# Patient Record
Sex: Female | Born: 1960 | Race: White | Hispanic: No | Marital: Married | Smoking: Former smoker
Health system: Southern US, Community
[De-identification: ages and names within clinical notes are randomized; demographics above are authoritative.]

## PROBLEM LIST (undated history)

## (undated) DIAGNOSIS — K219 Gastro-esophageal reflux disease without esophagitis: Secondary | ICD-10-CM

## (undated) DIAGNOSIS — R011 Cardiac murmur, unspecified: Secondary | ICD-10-CM

## (undated) DIAGNOSIS — I429 Cardiomyopathy, unspecified: Secondary | ICD-10-CM

## (undated) DIAGNOSIS — N3281 Overactive bladder: Secondary | ICD-10-CM

## (undated) DIAGNOSIS — R0602 Shortness of breath: Secondary | ICD-10-CM

## (undated) DIAGNOSIS — E162 Hypoglycemia, unspecified: Secondary | ICD-10-CM

## (undated) DIAGNOSIS — K2270 Barrett's esophagus without dysplasia: Secondary | ICD-10-CM

## (undated) DIAGNOSIS — D649 Anemia, unspecified: Secondary | ICD-10-CM

## (undated) DIAGNOSIS — I2089 Other forms of angina pectoris: Secondary | ICD-10-CM

## (undated) DIAGNOSIS — E785 Hyperlipidemia, unspecified: Secondary | ICD-10-CM

## (undated) DIAGNOSIS — G600 Hereditary motor and sensory neuropathy: Secondary | ICD-10-CM

## (undated) DIAGNOSIS — I10 Essential (primary) hypertension: Secondary | ICD-10-CM

## (undated) DIAGNOSIS — F32A Depression, unspecified: Secondary | ICD-10-CM

## (undated) DIAGNOSIS — Z9889 Other specified postprocedural states: Secondary | ICD-10-CM

## (undated) DIAGNOSIS — F419 Anxiety disorder, unspecified: Secondary | ICD-10-CM

## (undated) DIAGNOSIS — I208 Other forms of angina pectoris: Secondary | ICD-10-CM

## (undated) DIAGNOSIS — J189 Pneumonia, unspecified organism: Secondary | ICD-10-CM

## (undated) DIAGNOSIS — G629 Polyneuropathy, unspecified: Secondary | ICD-10-CM

## (undated) DIAGNOSIS — E538 Deficiency of other specified B group vitamins: Secondary | ICD-10-CM

## (undated) HISTORY — PX: ABDOMINAL HYSTERECTOMY: SHX81

## (undated) HISTORY — PX: COLONOSCOPY: SHX174

---
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
## 1979-12-27 HISTORY — PX: KNEE LIGAMENT RECONSTRUCTION: SHX1895

## 1983-12-27 HISTORY — PX: TUBAL LIGATION: SHX77

## 1985-12-26 HISTORY — PX: KNEE ARTHROSCOPY: SUR90

## 1993-12-26 HISTORY — PX: CARPAL TUNNEL RELEASE: SHX101

## 1994-12-26 HISTORY — PX: CARPAL TUNNEL RELEASE: SHX101

## 1994-12-26 HISTORY — PX: ABDOMINAL HYSTERECTOMY: SHX81

## 2004-11-12 HISTORY — PX: LAPAROSCOPIC GASTRIC BYPASS: SUR771

## 2004-12-12 HISTORY — PX: EVALUATION UNDER ANESTHESIA WITH HEMORRHOIDECTOMY AND PROCTOSCOPY: SHX5625

## 2007-08-17 ENCOUNTER — Encounter: Payer: Self-pay | Admitting: General Practice

## 2007-08-27 ENCOUNTER — Encounter: Payer: Self-pay | Admitting: General Practice

## 2009-10-21 ENCOUNTER — Ambulatory Visit: Payer: Self-pay | Admitting: Nurse Practitioner

## 2009-10-21 IMAGING — MG MM CAD SCREENING MAMMO
1 series · 4 of 4 positions shown · non-contrast
Comparison: none

REASON FOR EXAM: Screening Mammo
COMMENTS:

PROCEDURE:     MAM - MAM DGTL SCREENING MAMMO W/CAD  - [DATE]  [DATE]
RESULT:     Comparison is made to prior study dated [DATE] and [DATE].
The breasts demonstrate a heterogeneous parenchymal pattern. There is no
radiographic evidence to suggest malignancy.

[R CC · right · 4 of 4 slices shown]
[im 1/4]
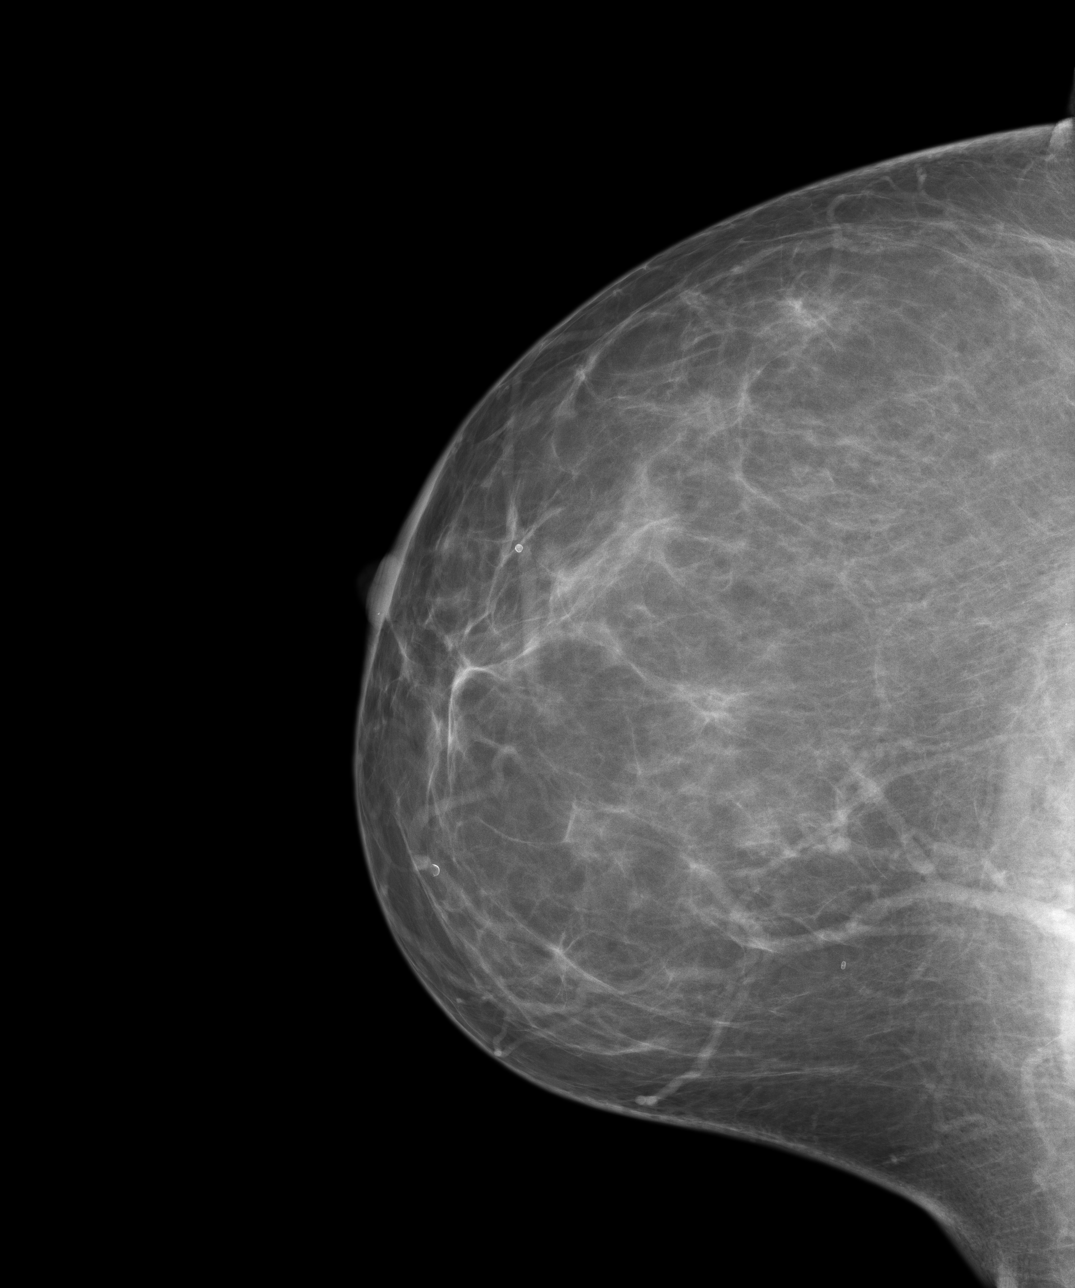
[im 2/4]
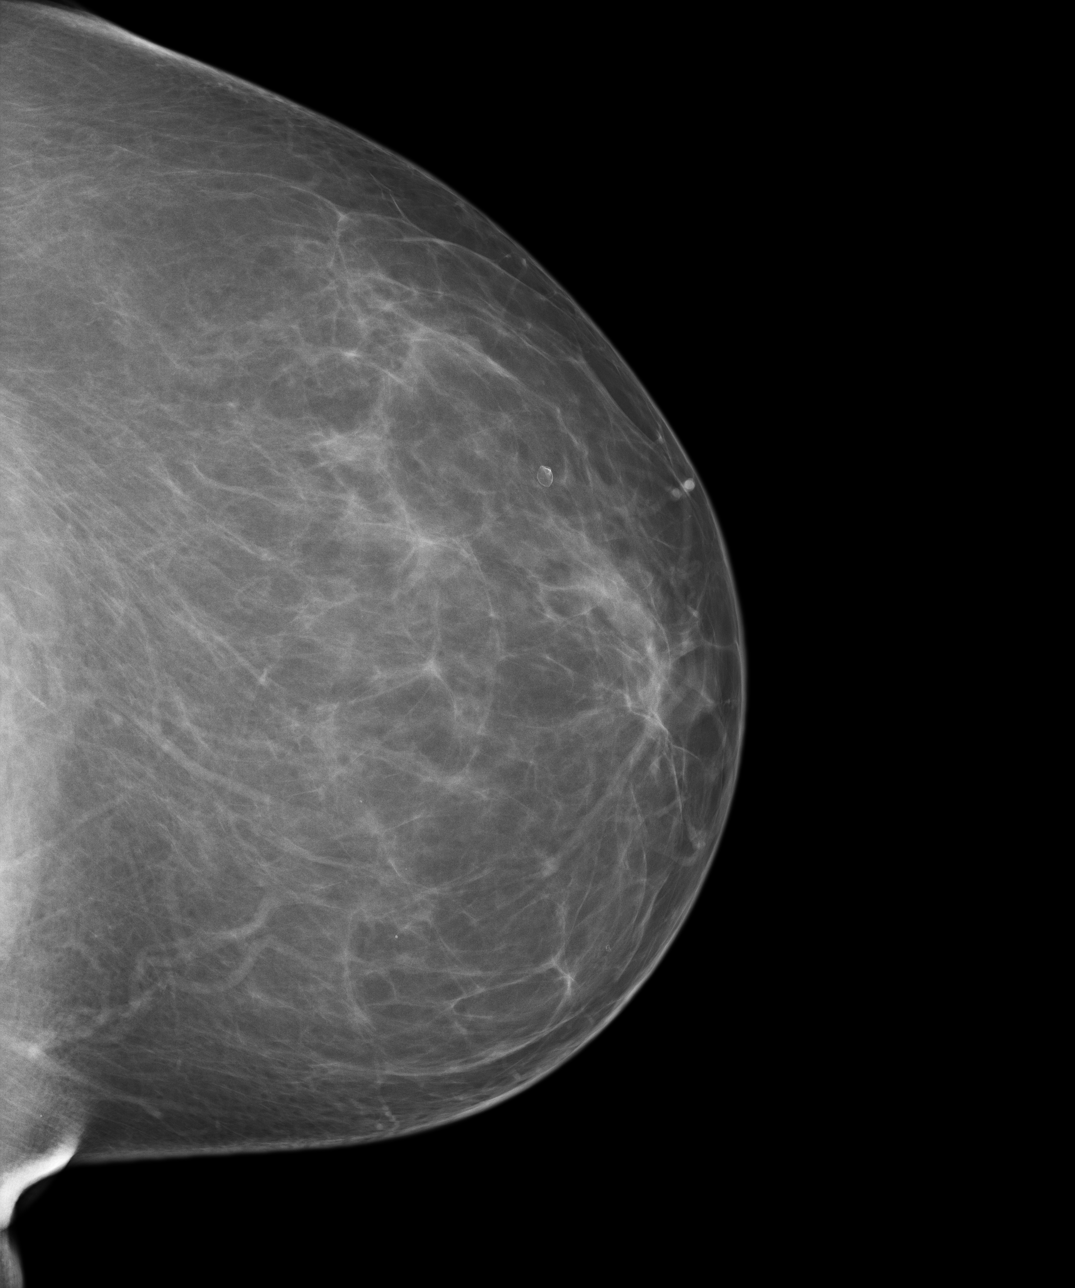
[im 3/4]
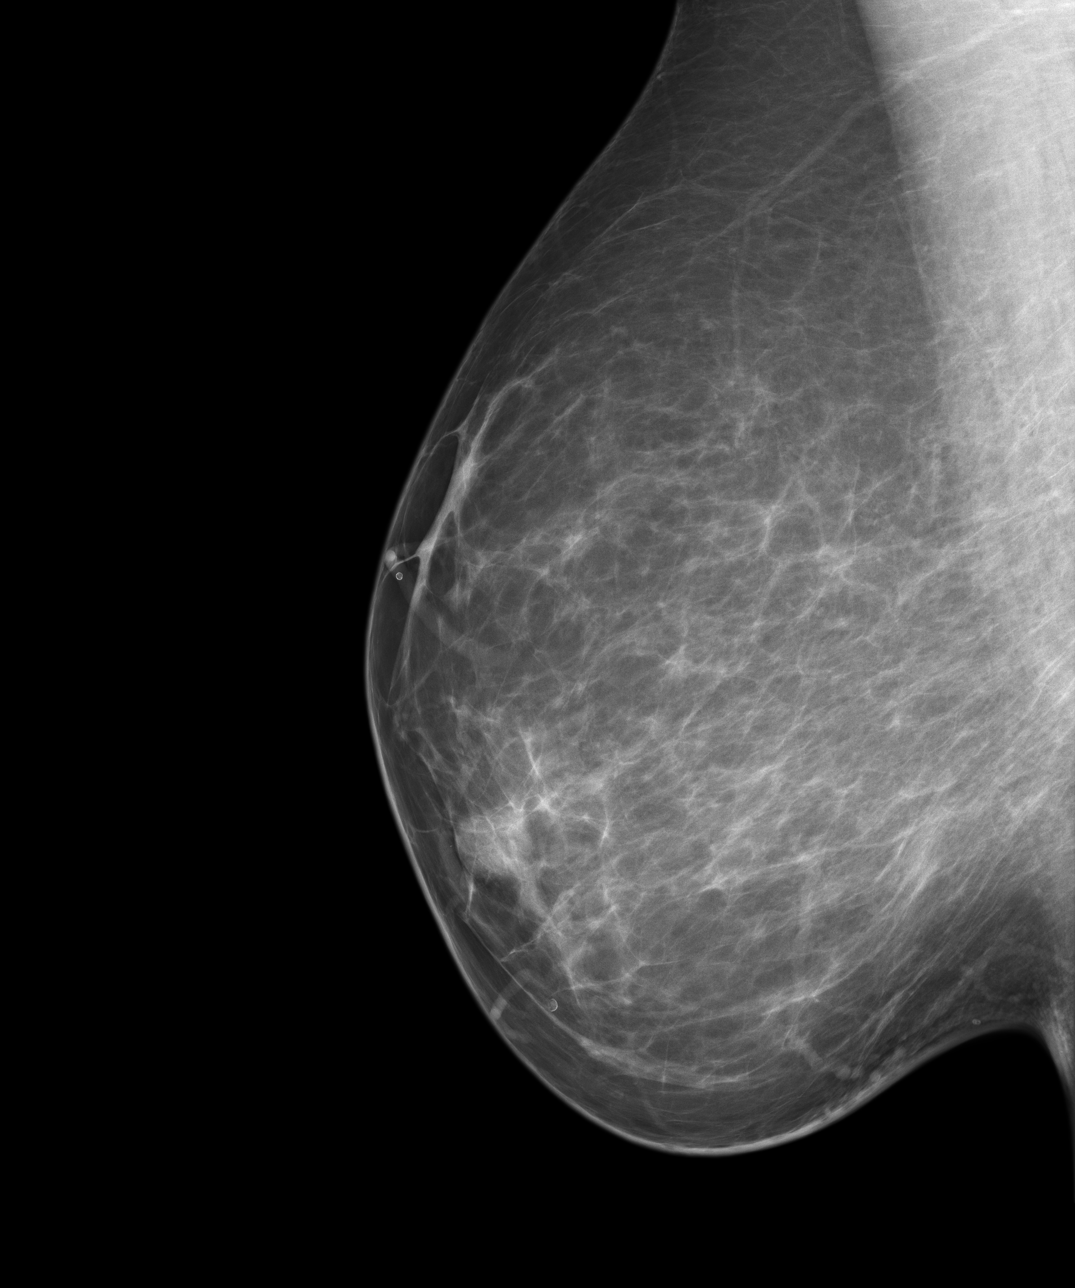
[im 4/4]
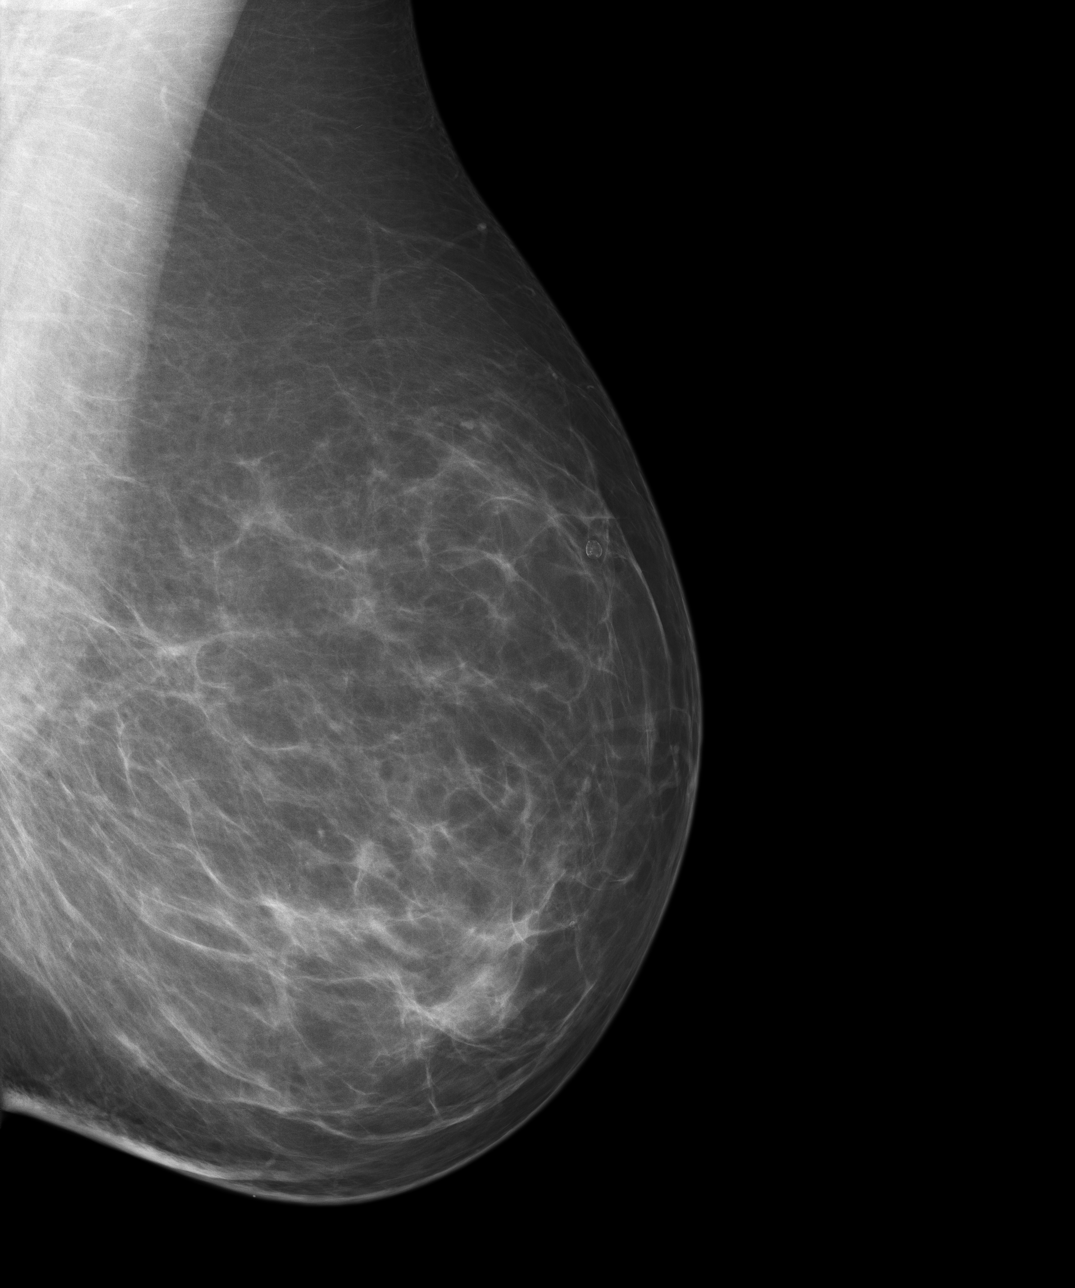

[4 of 4 positions shown; findings below may reference images not displayed]

IMPRESSION: BI-RADS: Category 2 - Benign Findings

A NEGATIVE MAMMOGRAM REPORT DOES NOT PRECLUDE BIOPSY OR OTHER EVALUATION OF
A CLINICALLY PALPABLE OR OTHERWISE SUSPICIOUS MASS OR LESION. BREAST CANCER
MAY NOT BE DETECTED BY MAMMOGRAPHY IN UP TO 10% OF CASES.

## 2010-05-03 HISTORY — PX: EVALUATION UNDER ANESTHESIA WITH HEMORRHOIDECTOMY AND PROCTOSCOPY: SHX5625

## 2011-02-24 ENCOUNTER — Ambulatory Visit: Payer: Self-pay | Admitting: Family Medicine

## 2011-02-24 IMAGING — MG MM DIGITAL SCREENING BILAT W/ CAD
1 series · 4 of 4 positions shown · non-contrast
Comparison: none

REASON FOR EXAM: scr
COMMENTS:

PROCEDURE:     MMM - MMM DGTL SCREENING MAMMO W/CAD  - [DATE]  [DATE]
RESULT:     Comparison is made to prior examinations [DATE],[DATE] and [DATE].
There are scattered fibroglandular densities bilaterally. No mass or
malignant appearing calcifications are identified.

[Series 7970: R CC · right · 4 of 4 slices shown]
[im 1/4]
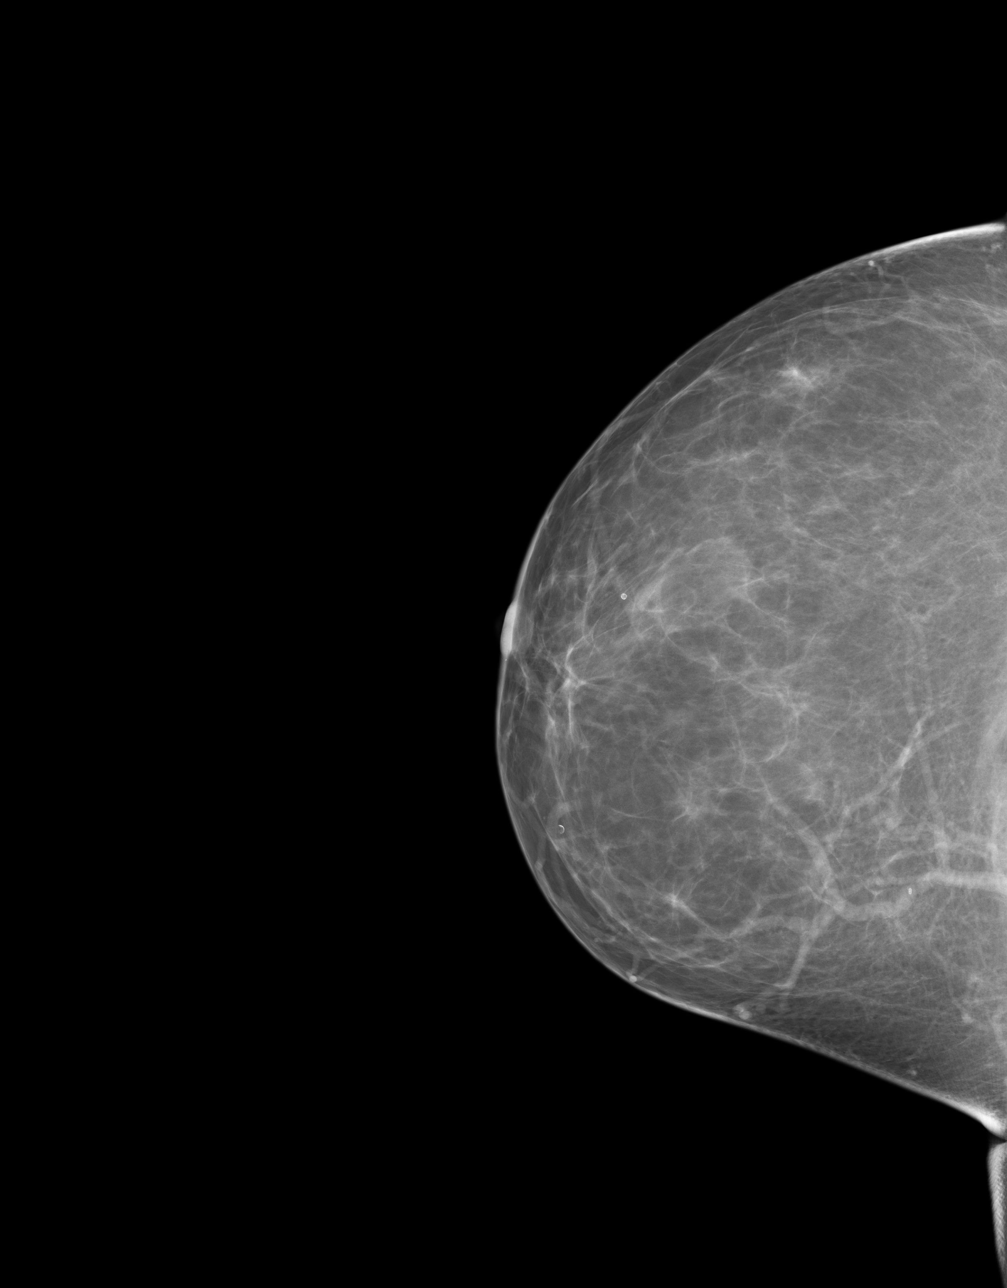
[im 2/4]
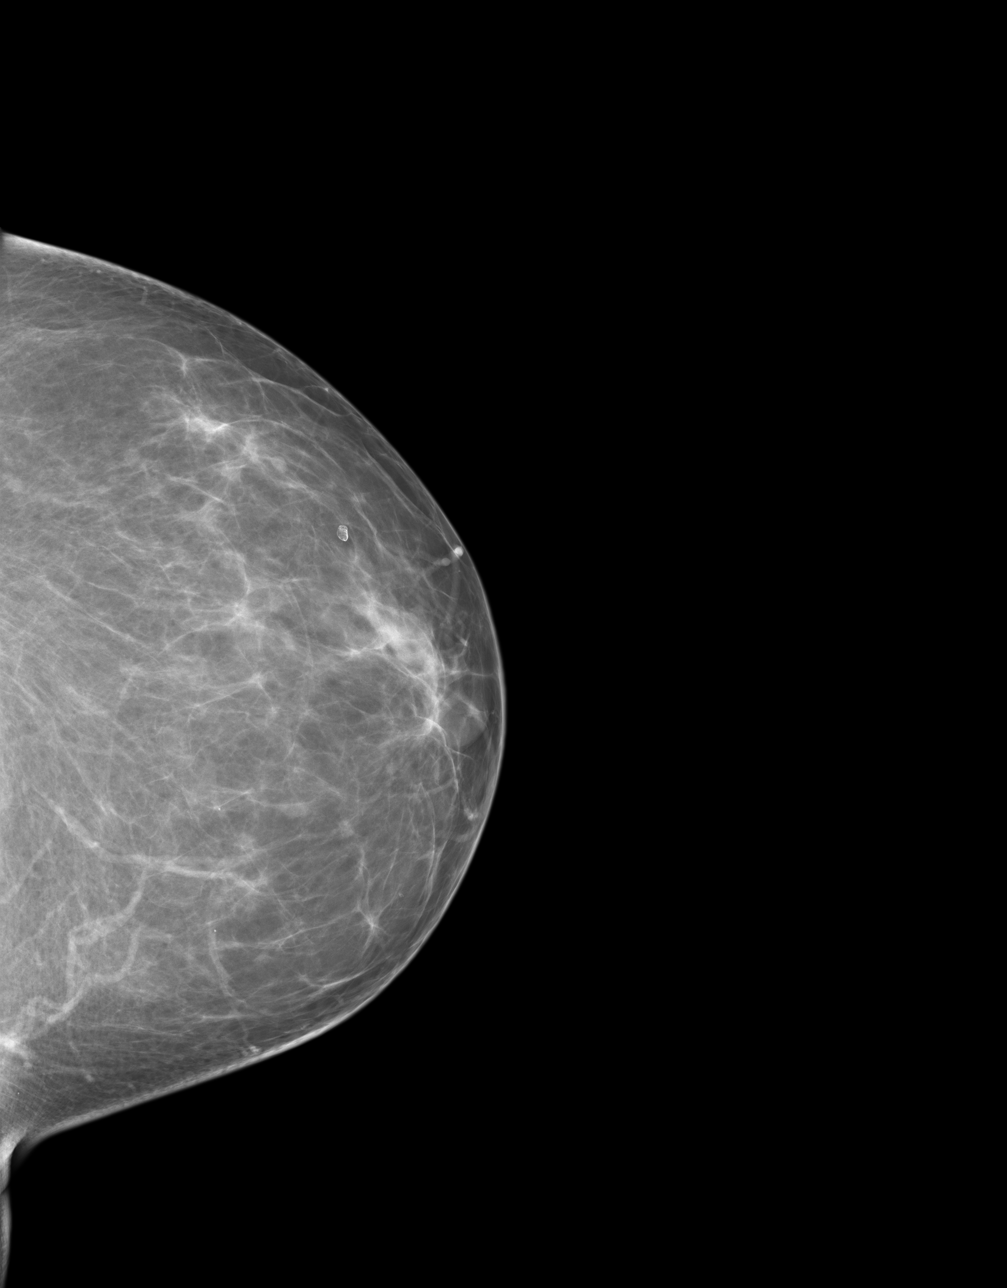
[im 3/4]
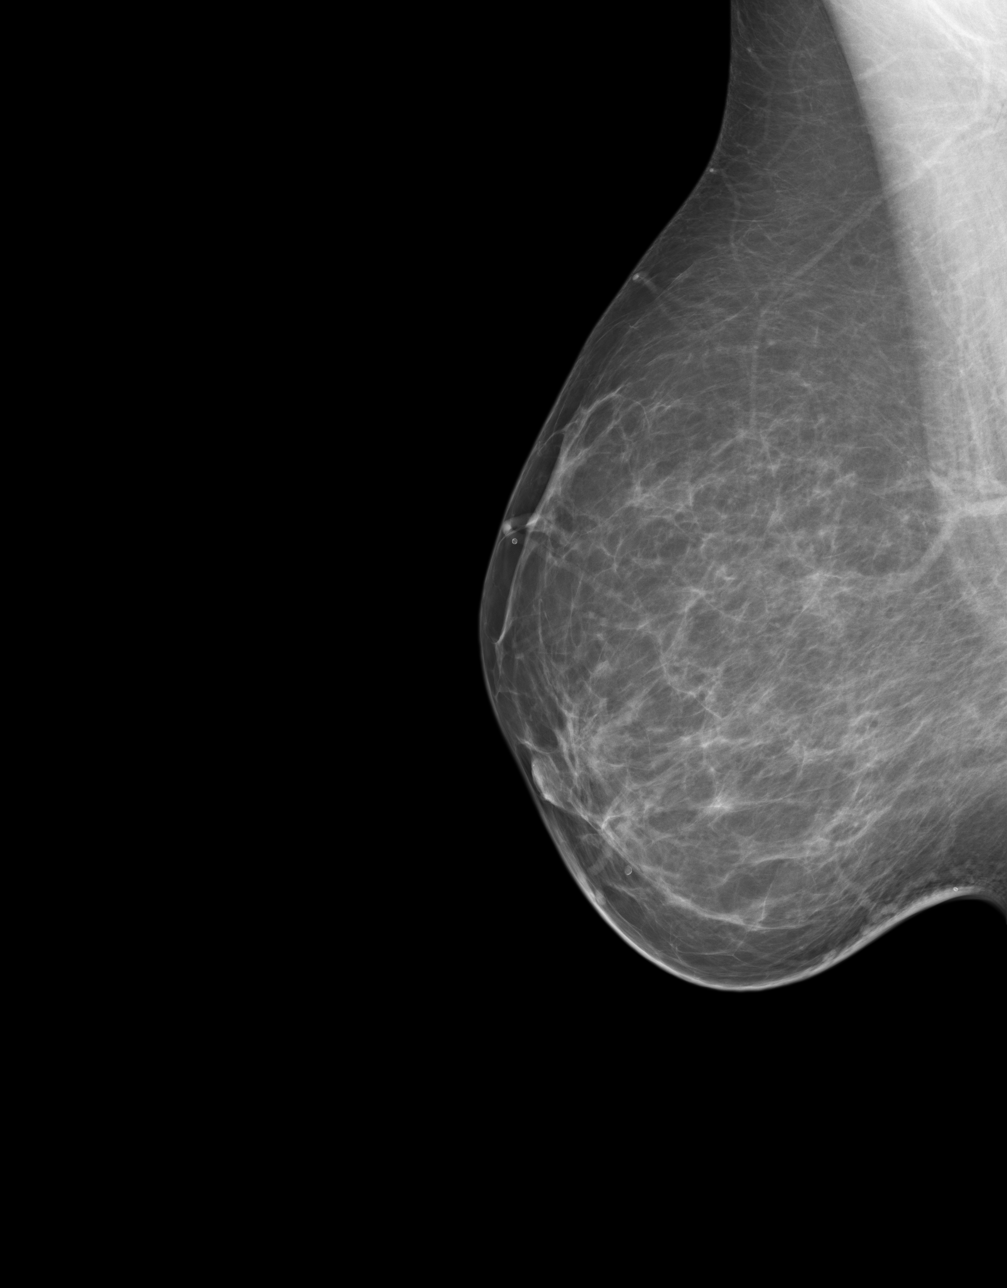
[im 4/4]
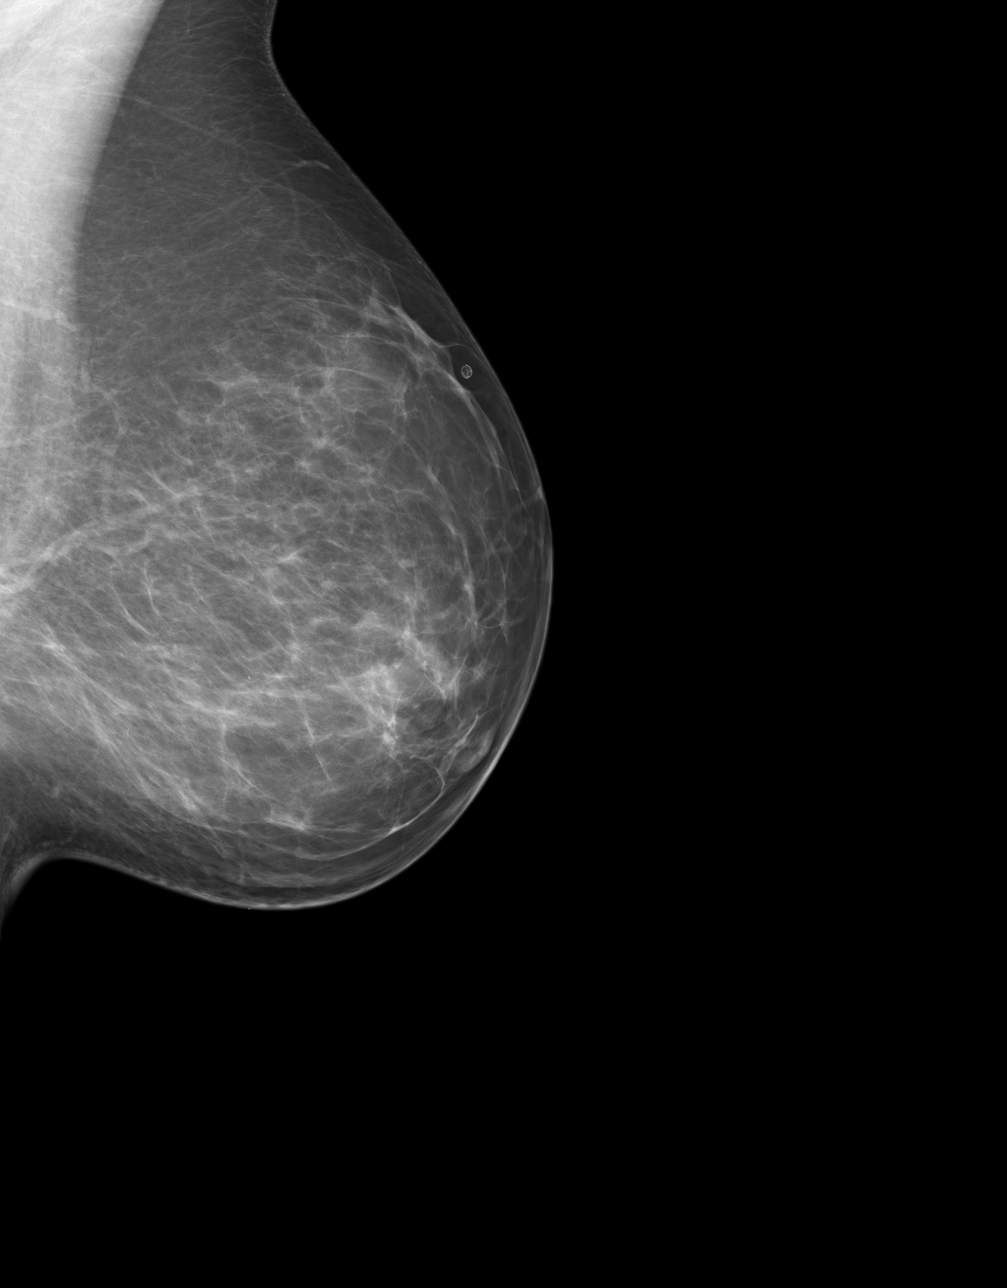

[4 of 4 positions shown; findings below may reference images not displayed]

IMPRESSION: 1.  Bilaterally benign appearing screening mammography.
2.  Annual screening mammography is recommended.
3.  BI-RADS: Category 1 Negative.

Thank you for this opportunity to contribute to the care of your patient.

A NEGATIVE MAMMOGRAM REPORT DOES NOT PRECLUDE BIOPSY OR OTHER EVALUATION OF
A CLINICALLY PALPABLE OR OTHERWISE SUSPICIOUS MASS OR LESION. BREAST CANCER
MAY NOT BE DETECTED BY MAMMOGRAPHY IN UP TO 10% OF CASES.

## 2012-10-17 ENCOUNTER — Ambulatory Visit: Payer: Self-pay | Admitting: Family

## 2012-10-17 IMAGING — MG MM DIGITAL SCREENING BILAT W/ CAD
1 series · 4 of 4 positions shown · non-contrast
Comparison: none

REASON FOR EXAM: SCR
COMMENTS:

PROCEDURE:     MMM - MMM DGT SCR NO ORDER W/CAD  - [DATE]  [DATE]
RESULT:     COMPARISON:  [DATE], [DATE], [DATE] from [REDACTED]
TECHNIQUE: Digital screening mammograms were obtained. FDA approved
computer-aided detection (CAD) for mammography was utilized for this study.

[Series 9469: R CC · right · 4 of 4 slices shown]
[im 1/4]
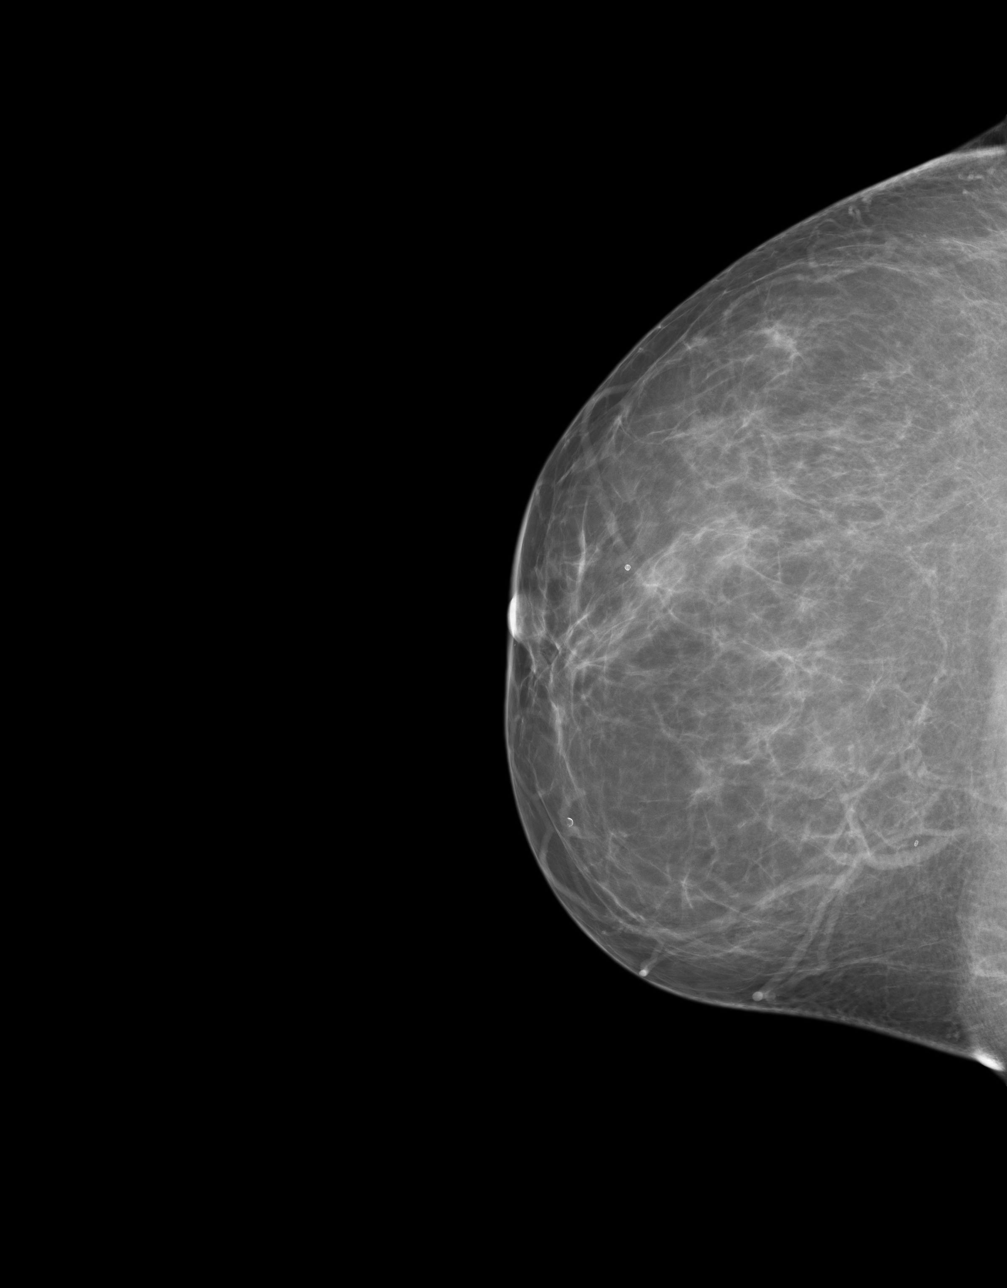
[im 2/4]
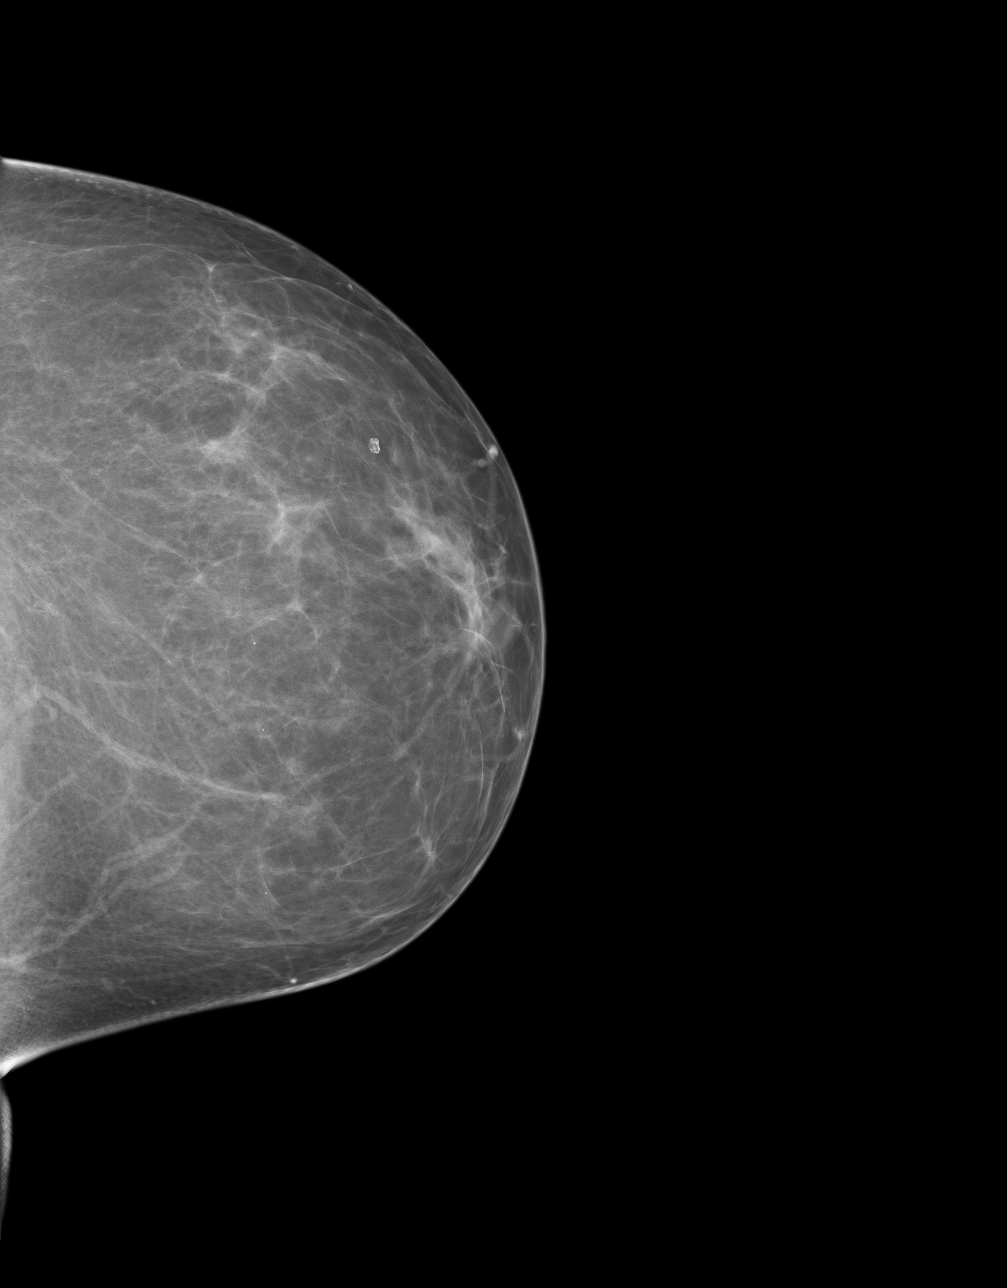
[im 3/4]
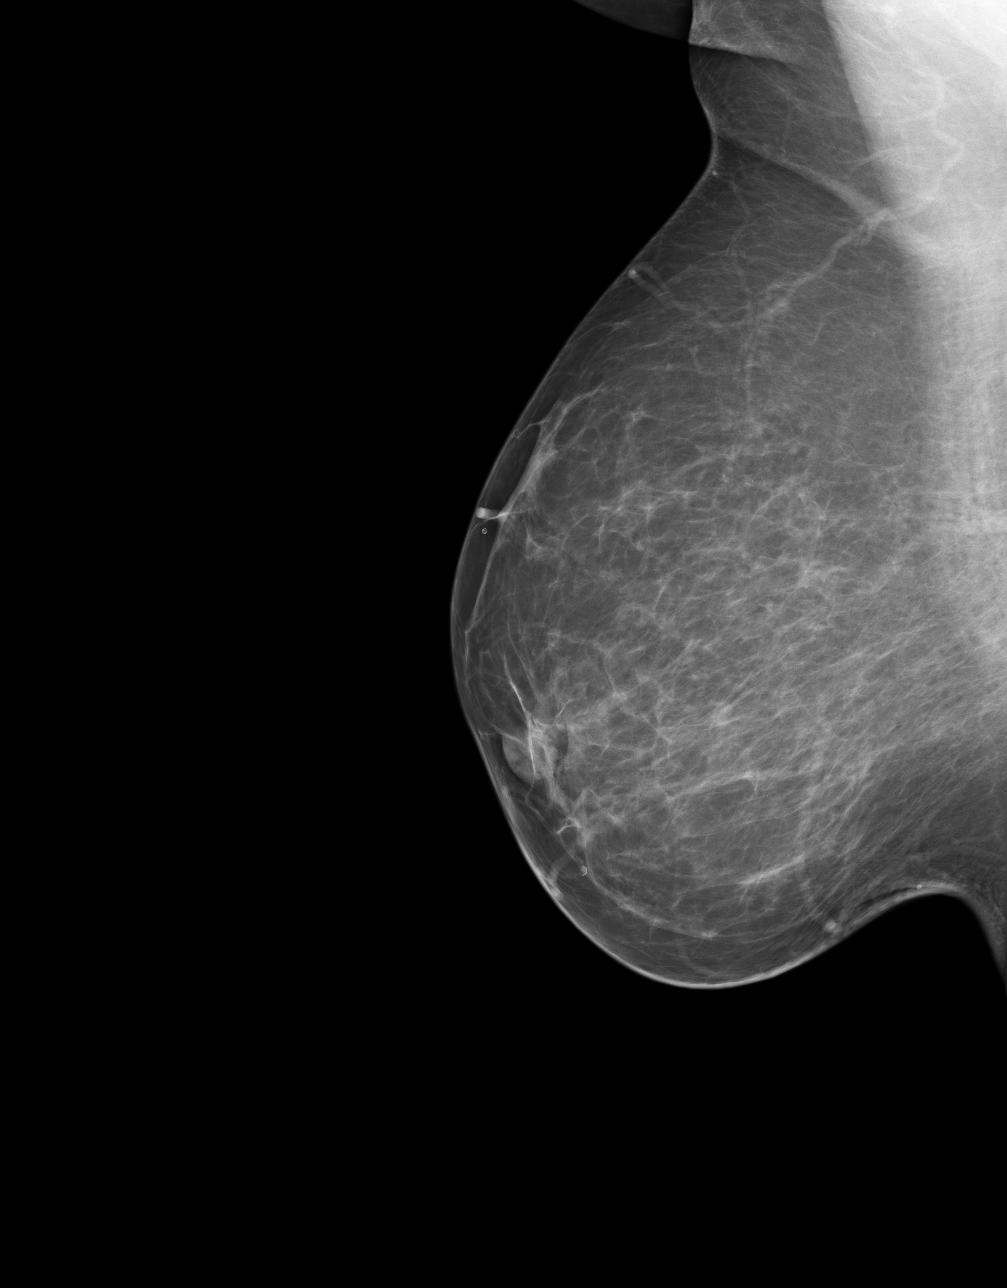
[im 4/4]
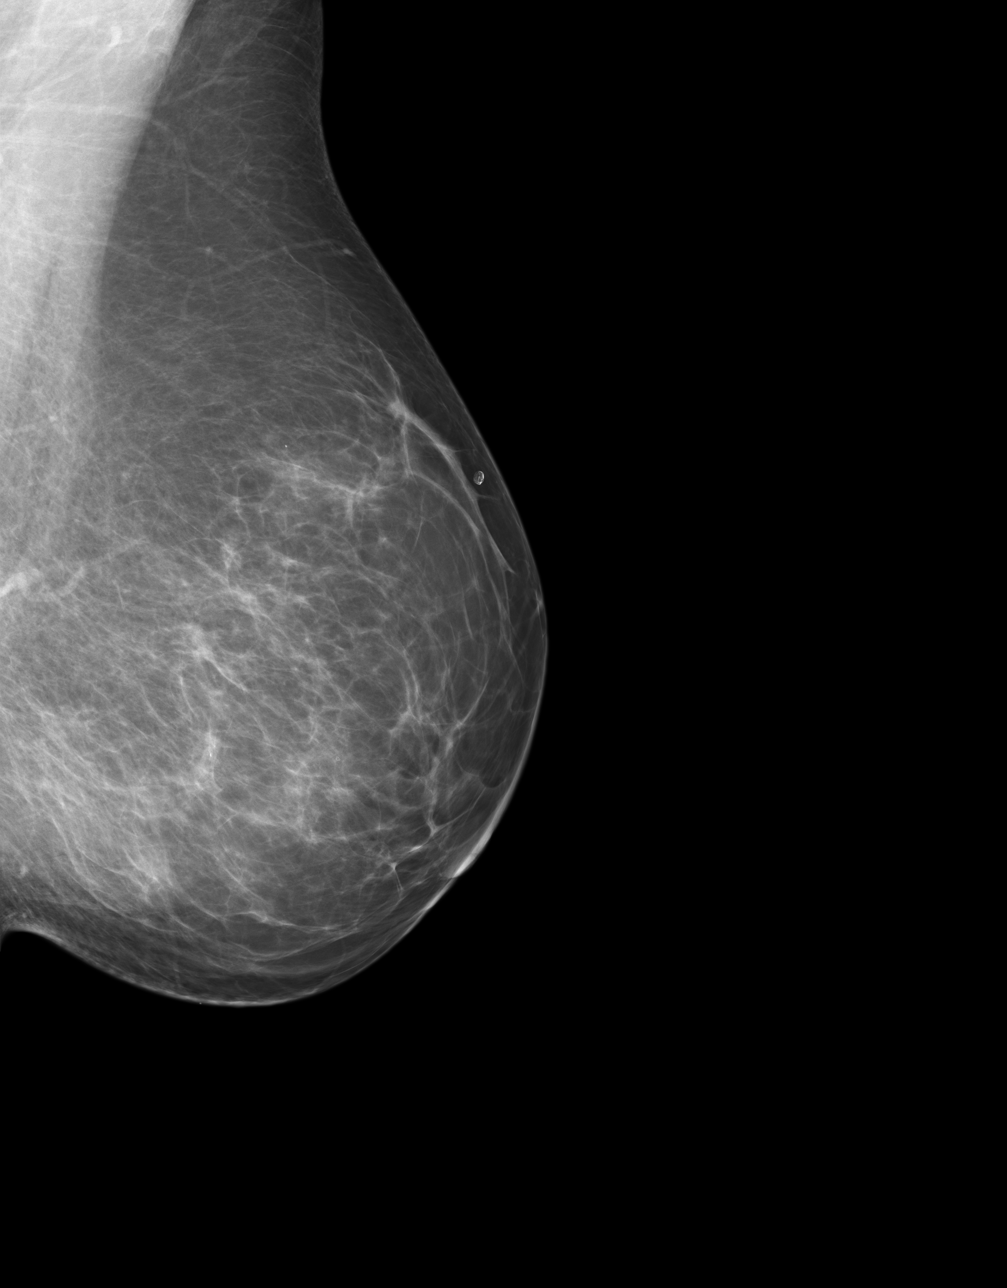

[4 of 4 positions shown; findings below may reference images not displayed]

FINDING: Bilateral breasts demonstrate a scattered fibroglandular density. There is
no dominant mass, architectural distortion or clusters of suspicious
microcalcifications.
IMPRESSION: 1.     Stable bilateral mammogram.
2.     Annual mammographic follow up recommended.
3.     BI-RADS:  Category 2- Benign.

A negative mammogram report does not preclude biopsy or other evaluation of
a clinically palpable or otherwise suspicious mass or lesion. Breast cancer
may not be detected by mammography in up to 10% of cases.

[REDACTED]

## 2013-06-10 ENCOUNTER — Ambulatory Visit: Payer: Self-pay

## 2013-06-10 IMAGING — CR RIGHT HAND - COMPLETE 3+ VIEW
1 series · 3 of 3 positions shown · non-contrast
Comparison: none

REASON FOR EXAM: right hand pain
COMMENTS:

PROCEDURE:     MDR - MDR HAND RT COMP W/OBLIQUES  - [DATE]  [DATE]
RESULT:     Comparison: None.

[Series 1: pa · 0.17mm/px · 3 of 3 slices shown]
[im 1/3]
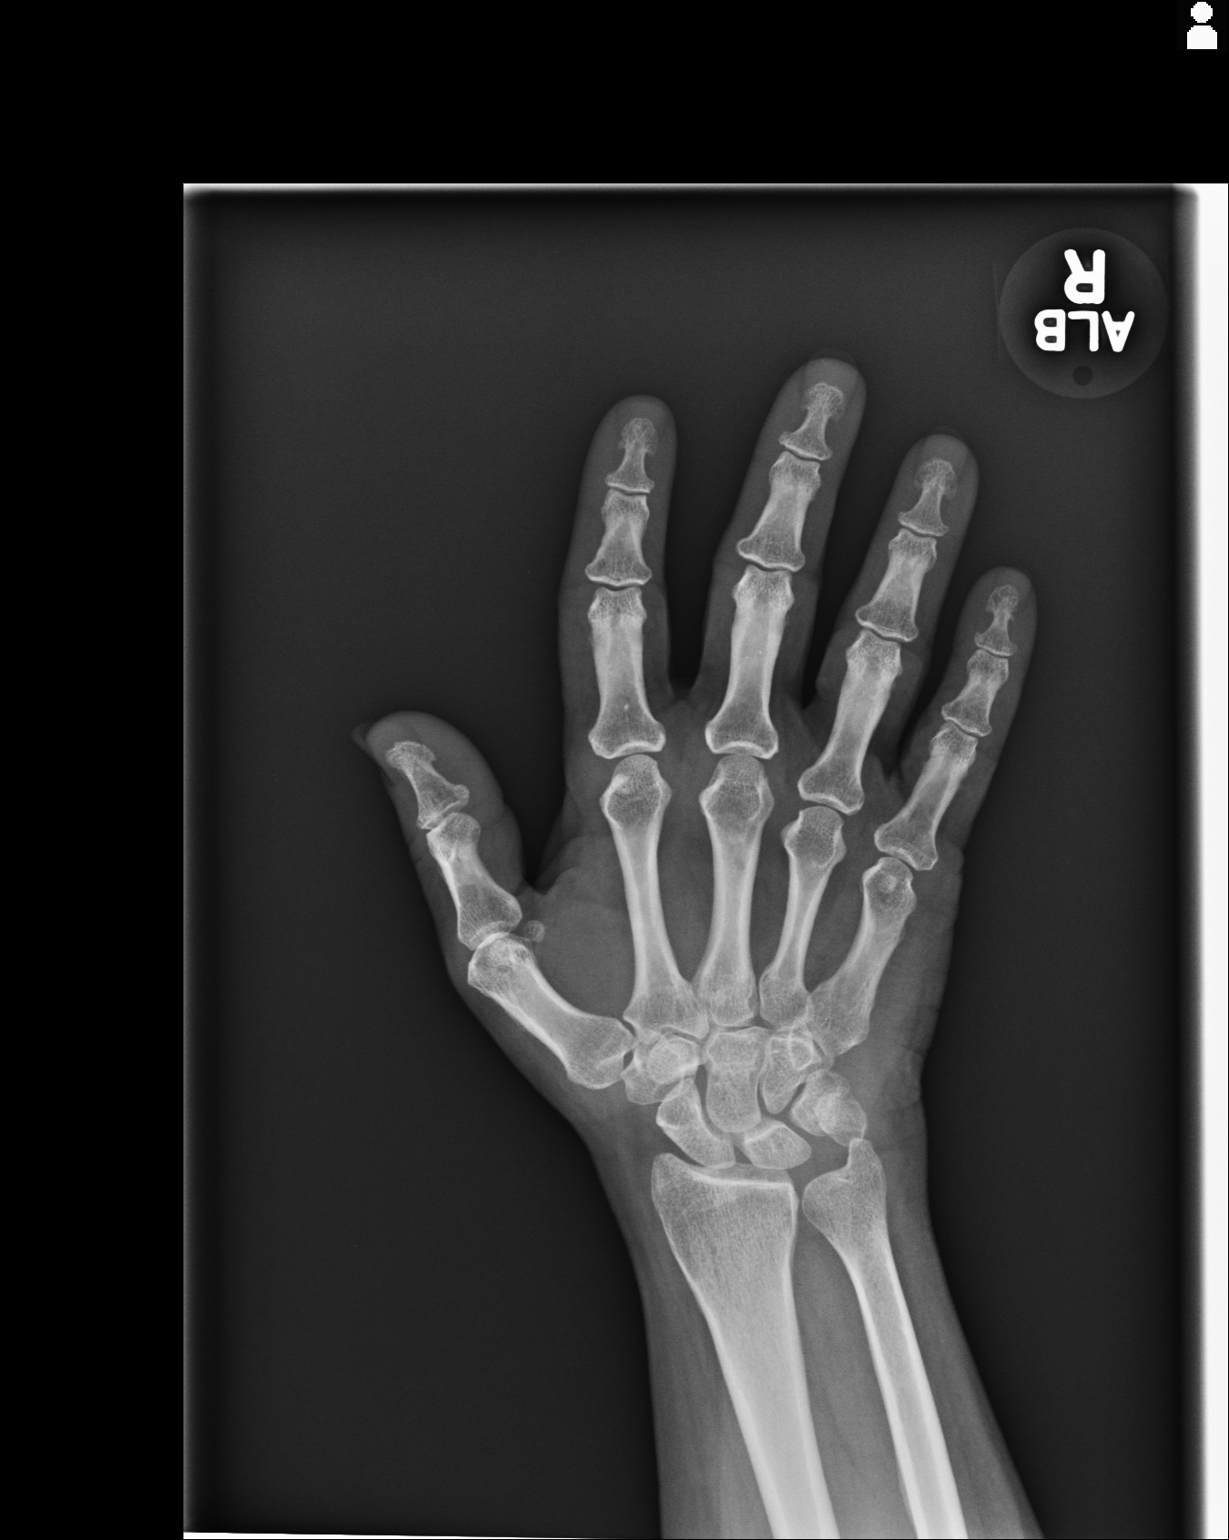
[im 2/3]
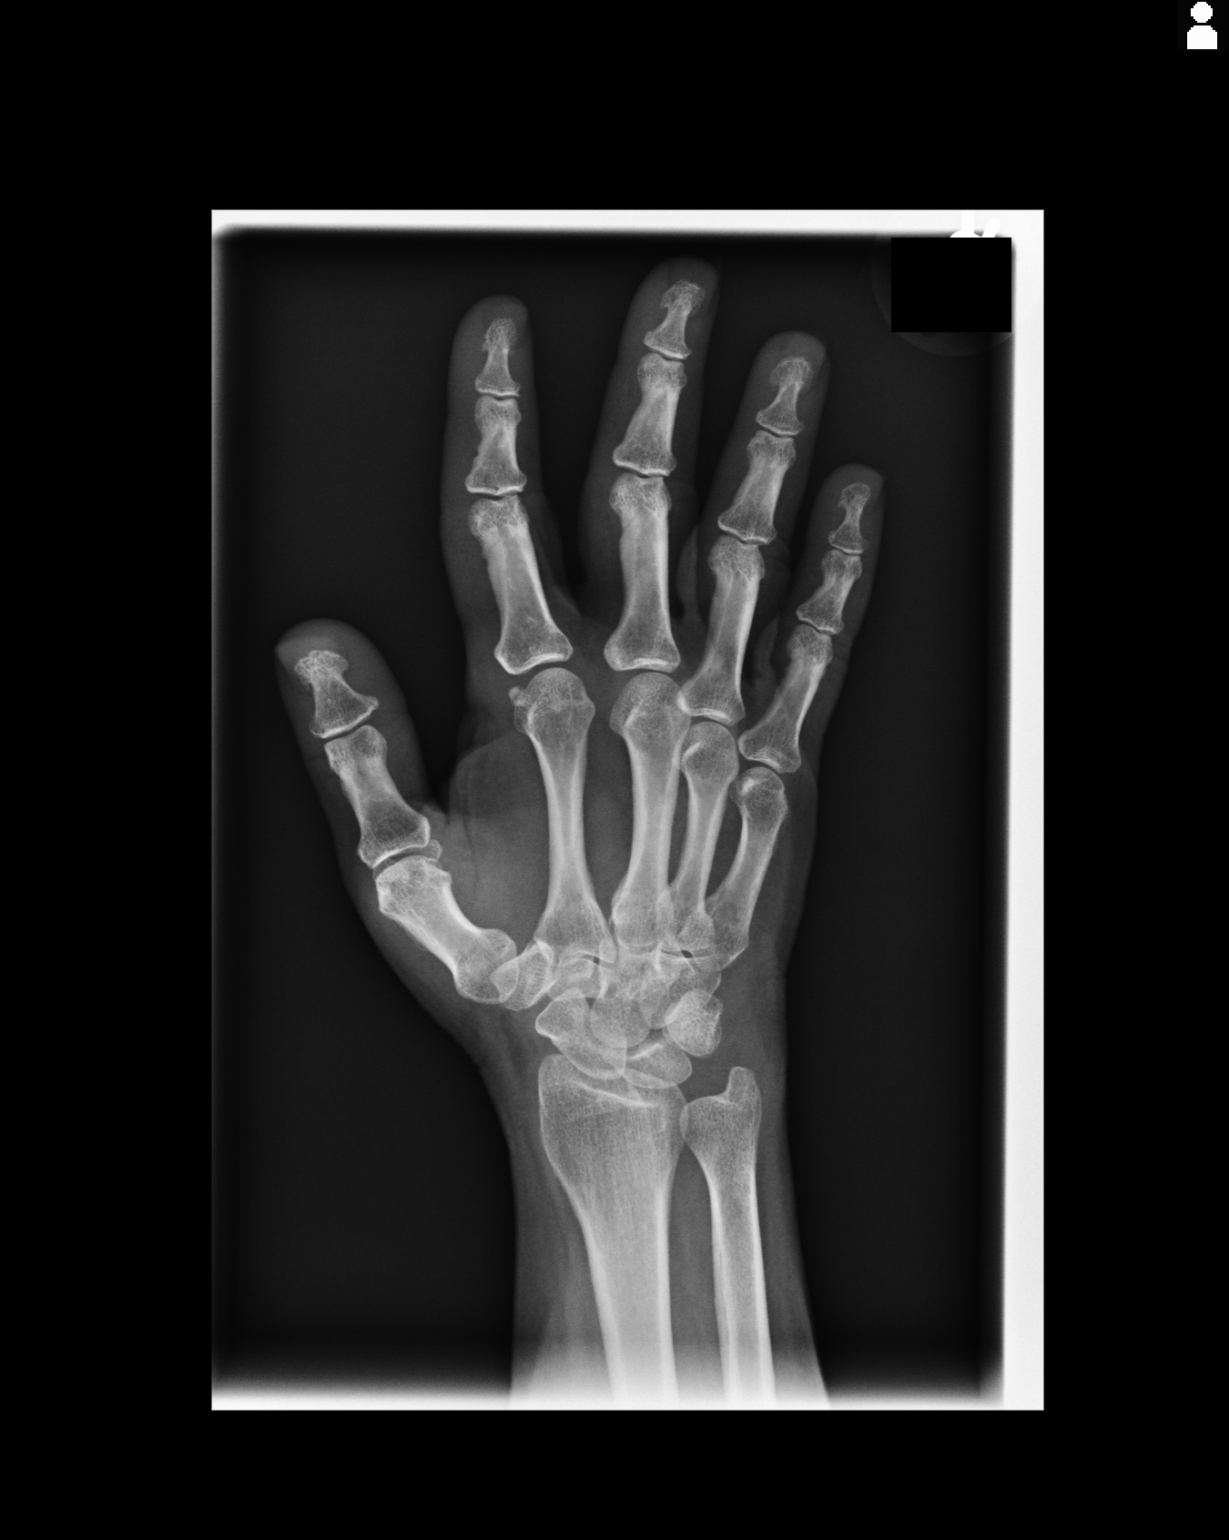
[im 3/3]
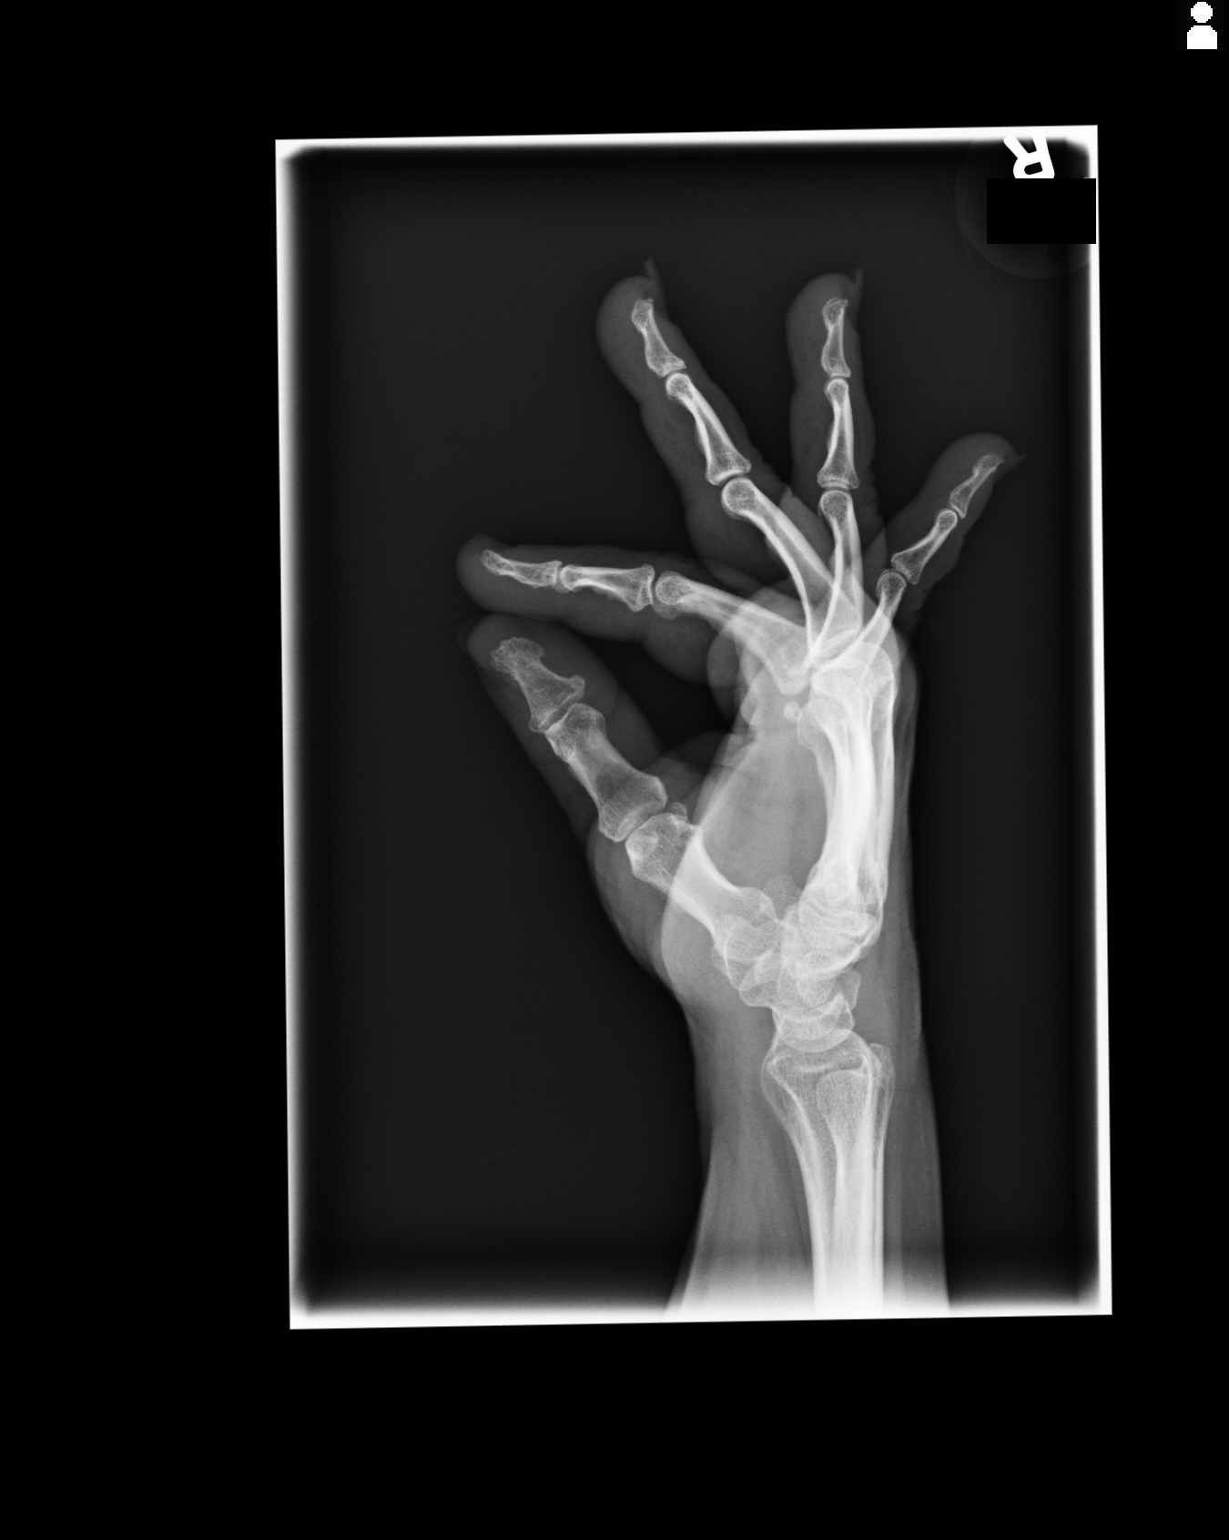

[3 of 3 positions shown; findings below may reference images not displayed]

FINDINGS: No acute fracture. Normal alignment.
IMPRESSION: No acute fracture.

[REDACTED]

## 2013-06-10 IMAGING — CR DG WRIST COMPLETE 3+V*R*
1 series · 4 of 4 positions shown · non-contrast
Comparison: none

REASON FOR EXAM: right wrist pain
COMMENTS:

PROCEDURE:     MDR - MDR WRIST RT COMP WITH OBLIQUES  - [DATE]  [DATE]
RESULT:     Comparison: None.

[Series 1: pa · 0.17mm/px · 4 of 4 slices shown]
[im 1/4]
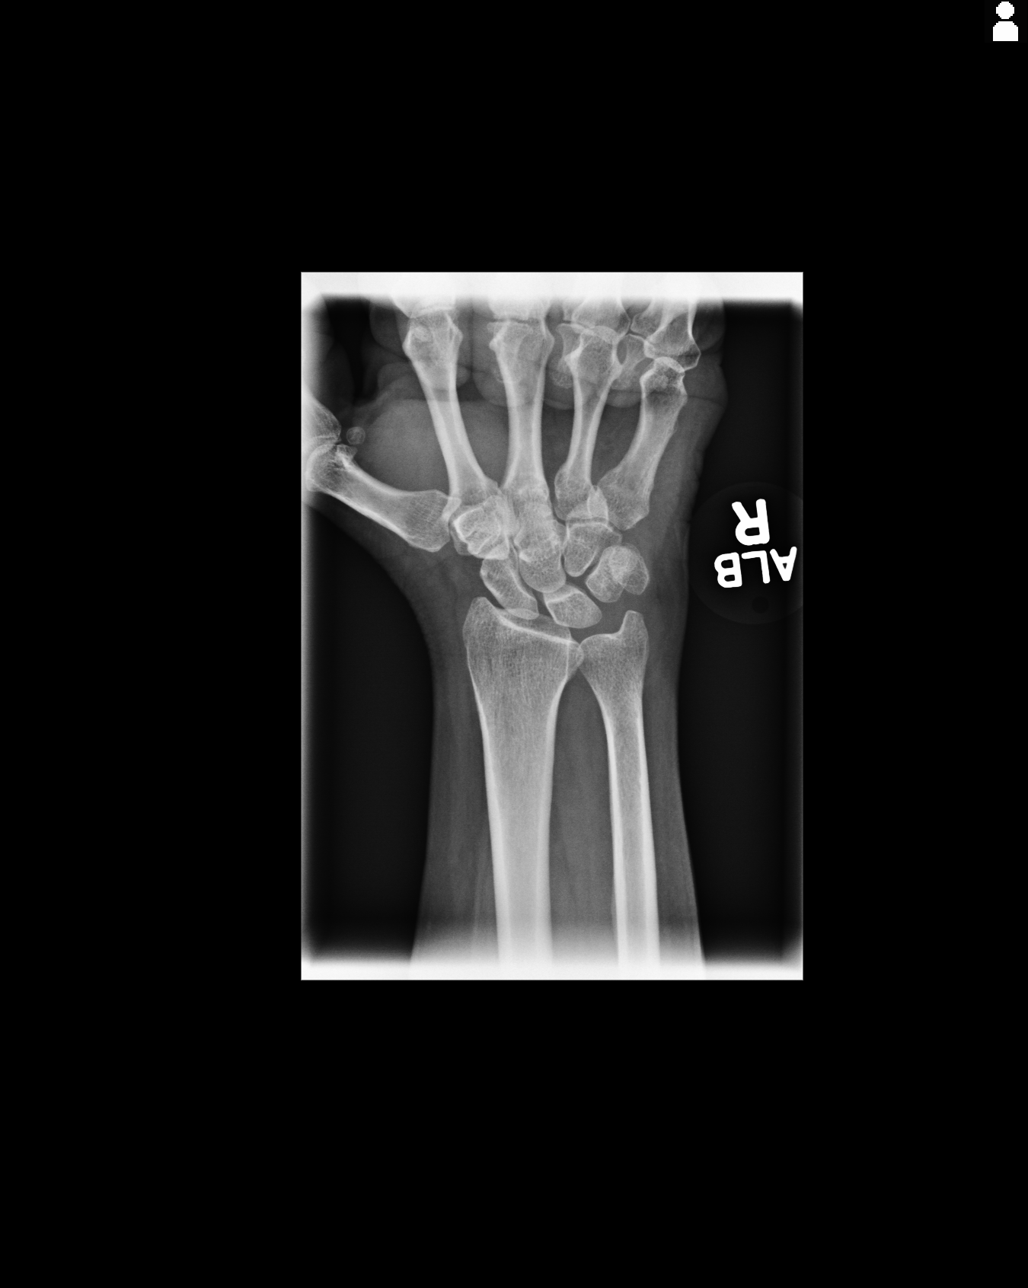
[im 2/4]
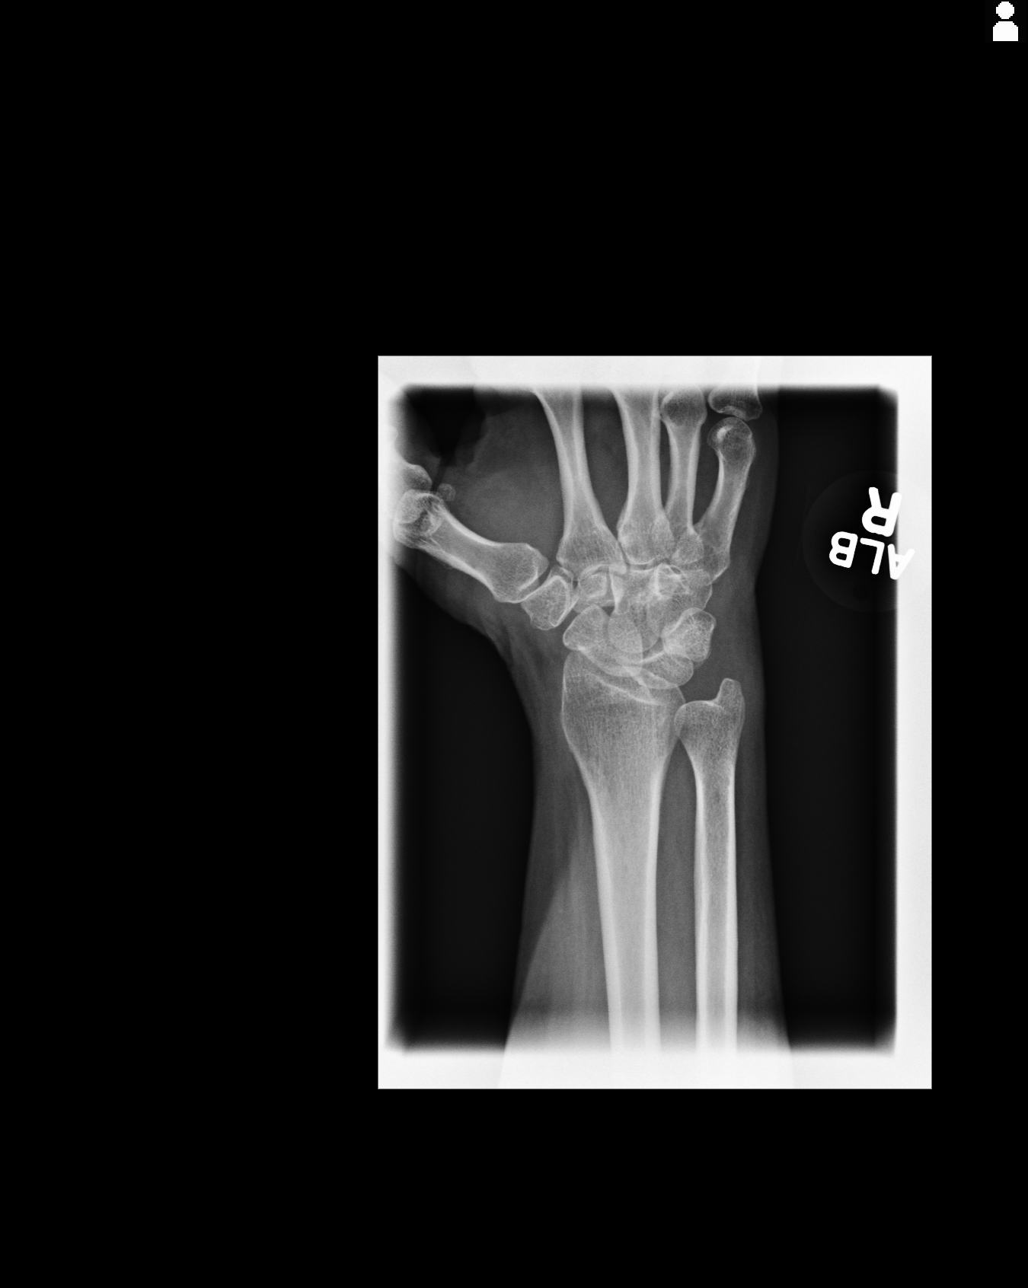
[im 3/4]
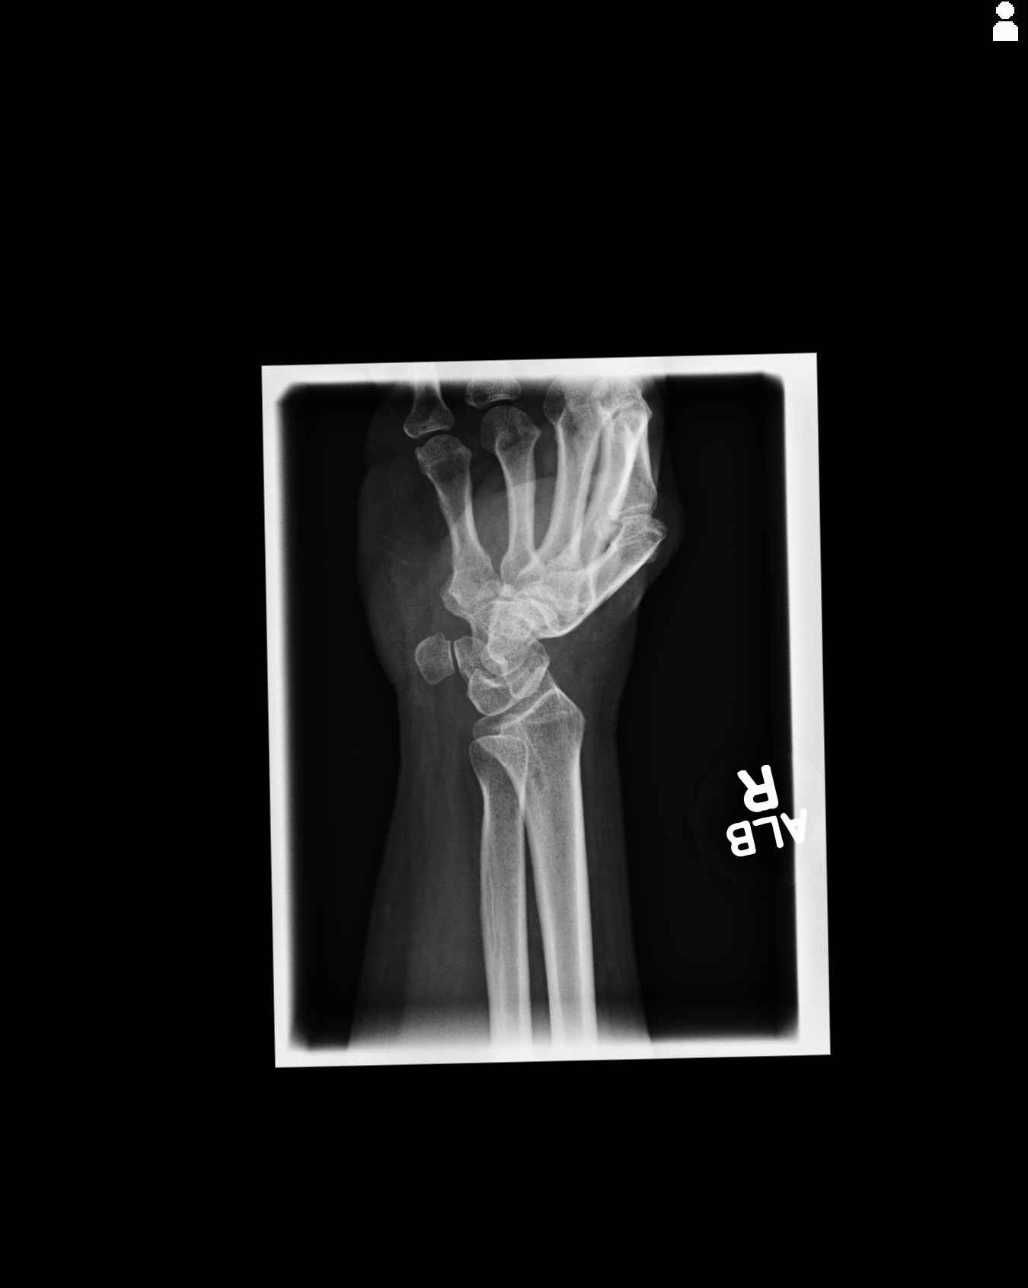
[im 4/4]
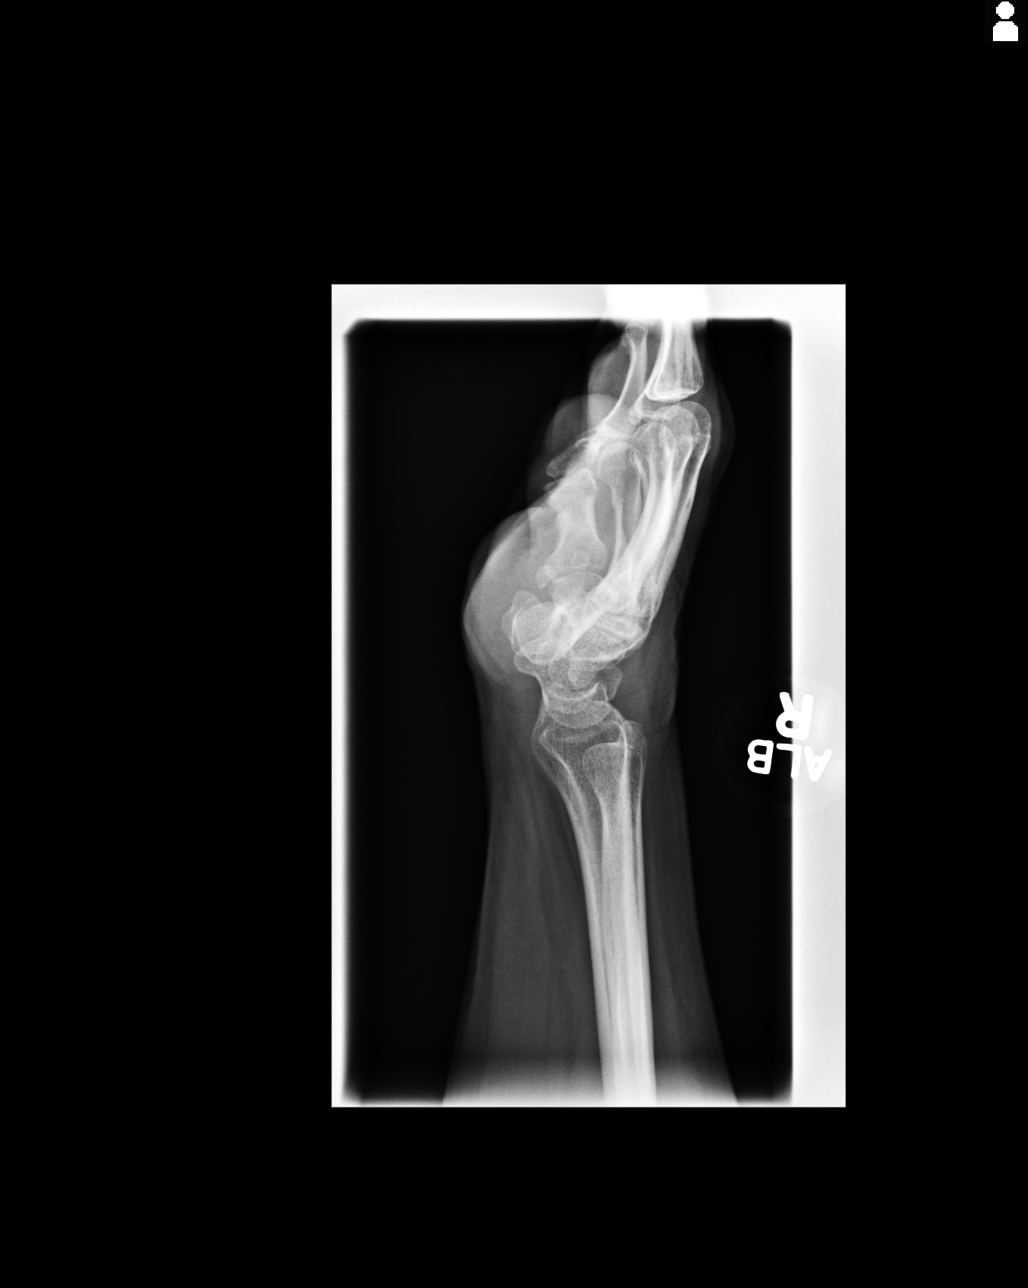

[4 of 4 positions shown; findings below may reference images not displayed]

FINDINGS: No acute fracture. Normal alignment.
IMPRESSION: No acute fracture seen. If there is continued clinical concern for a
radiographically occult scaphoid fracture, such as snuff box tenderness,
further evaluation with MRI or immobilization and followup radiographs is
recommended.

[REDACTED]

## 2016-09-28 ENCOUNTER — Other Ambulatory Visit: Payer: Self-pay | Admitting: Family Medicine

## 2016-09-28 DIAGNOSIS — Z1231 Encounter for screening mammogram for malignant neoplasm of breast: Secondary | ICD-10-CM

## 2016-10-04 ENCOUNTER — Ambulatory Visit
Admission: RE | Admit: 2016-10-04 | Discharge: 2016-10-04 | Disposition: A | Discharge status: Home / Self Care | Payer: BLUE CROSS/BLUE SHIELD | Source: Ambulatory Visit | Attending: Family Medicine | Admitting: Family Medicine

## 2016-10-04 ENCOUNTER — Encounter: Payer: Self-pay | Admitting: Radiology

## 2016-10-04 DIAGNOSIS — Z1231 Encounter for screening mammogram for malignant neoplasm of breast: Secondary | ICD-10-CM | POA: Diagnosis present

## 2016-10-04 IMAGING — MG MM DIGITAL SCREENING BILAT W/ CAD
4 series · 4 of 4 positions shown · non-contrast
Comparison: Previous exam(s).

CLINICAL DATA: Screening.

EXAM:
DIGITAL SCREENING BILATERAL MAMMOGRAM WITH CAD

[L CC]
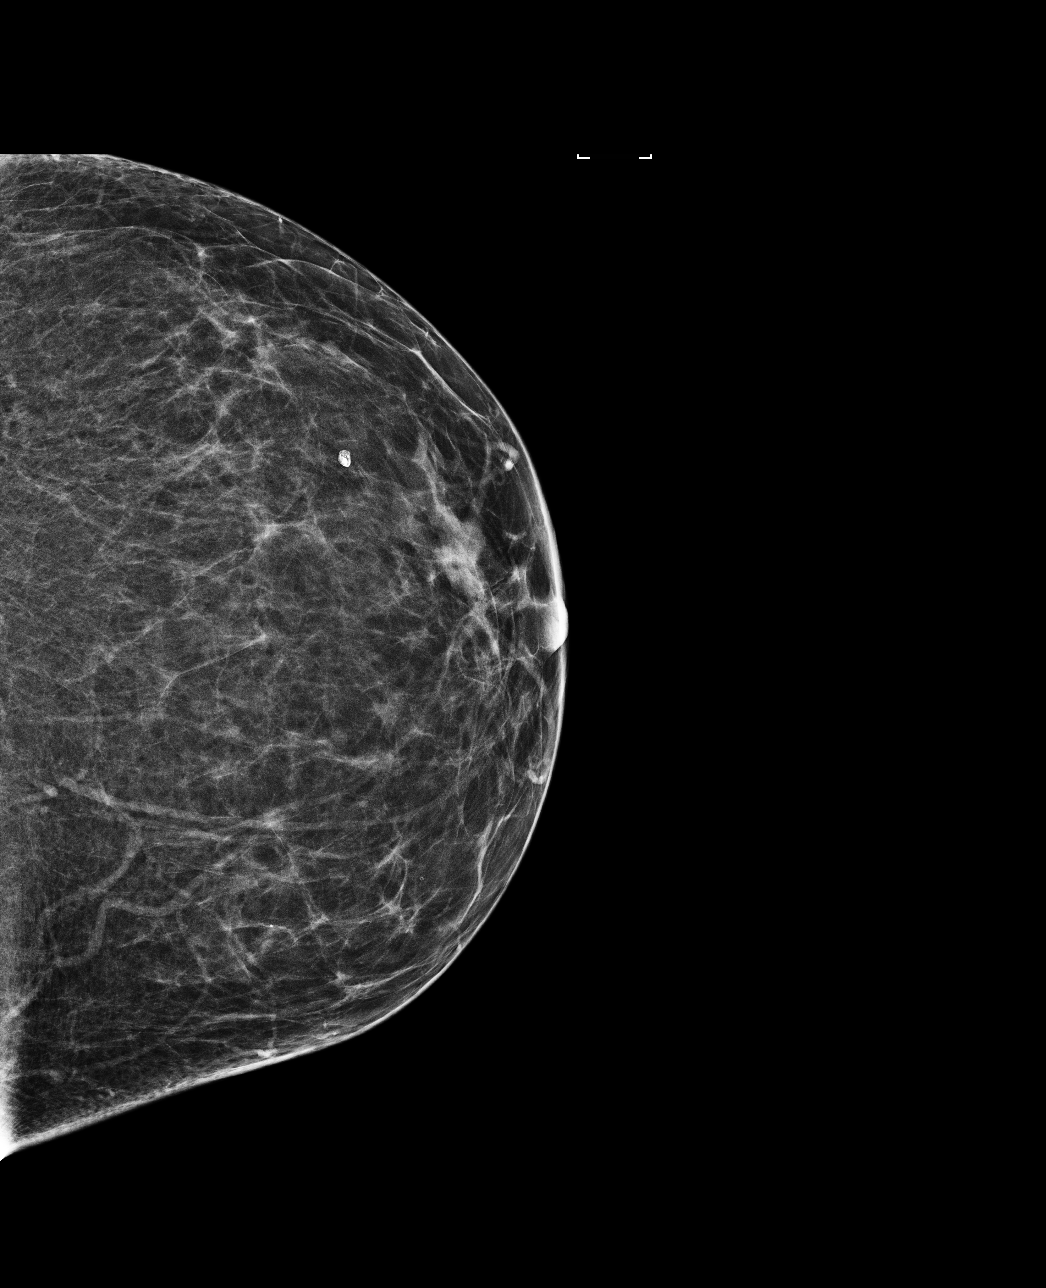

[R MLO]
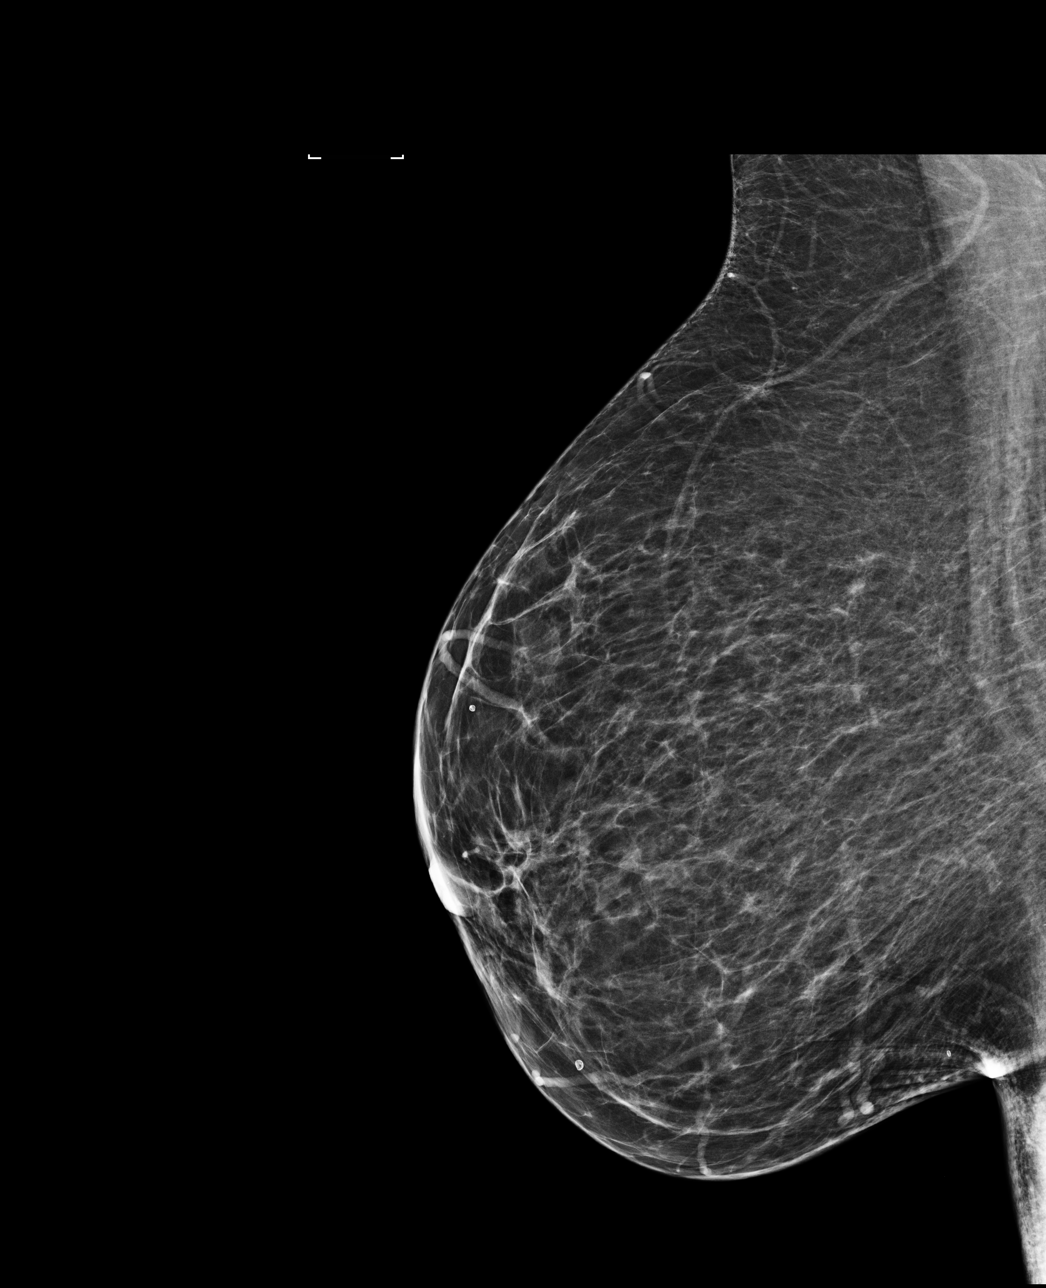

[R CC]
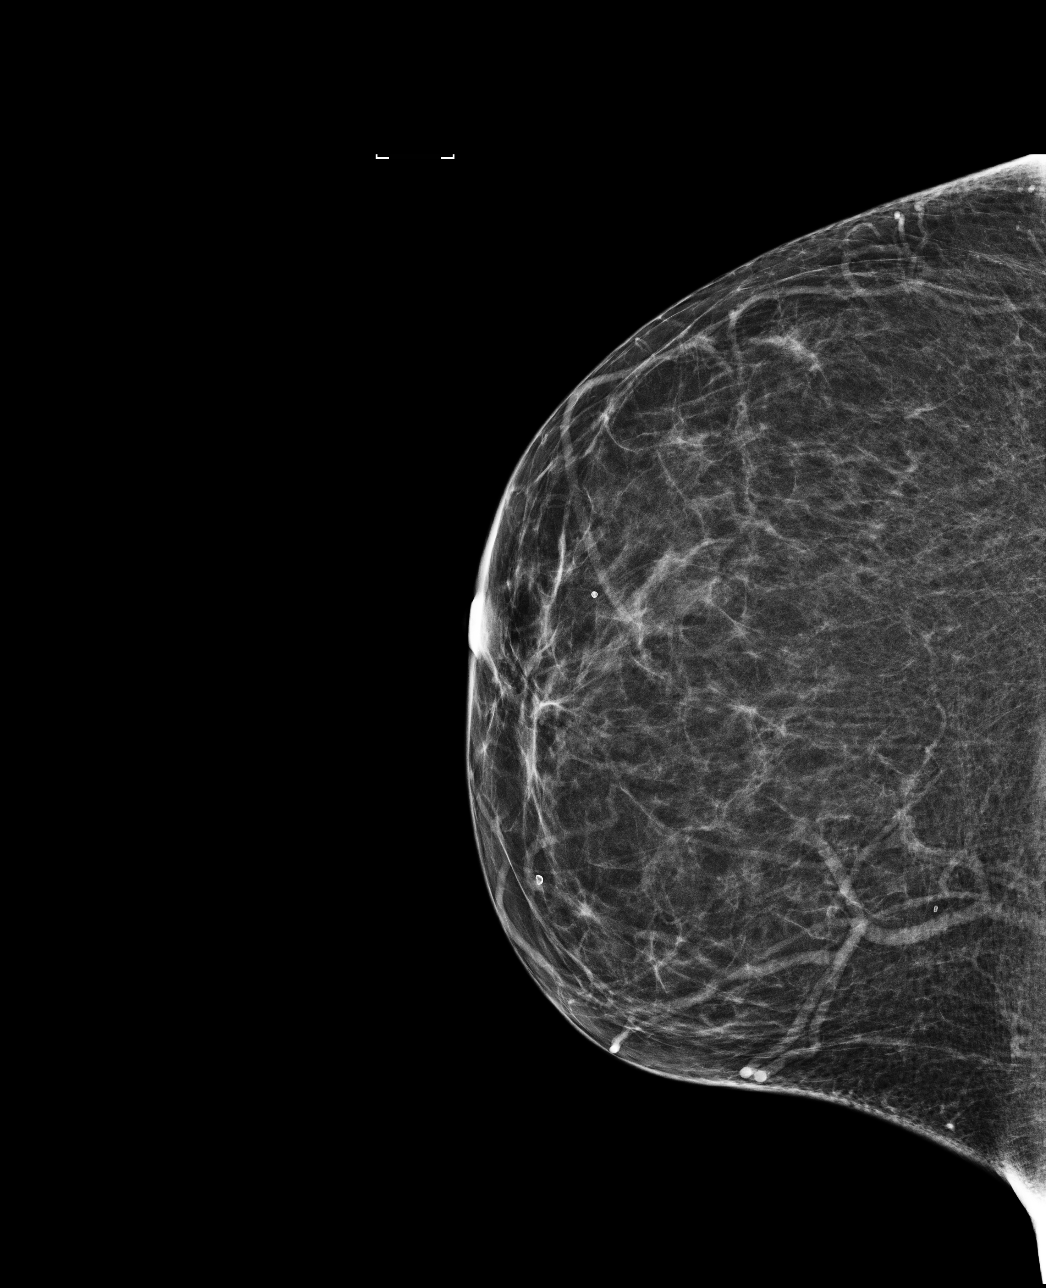

[L MLO]
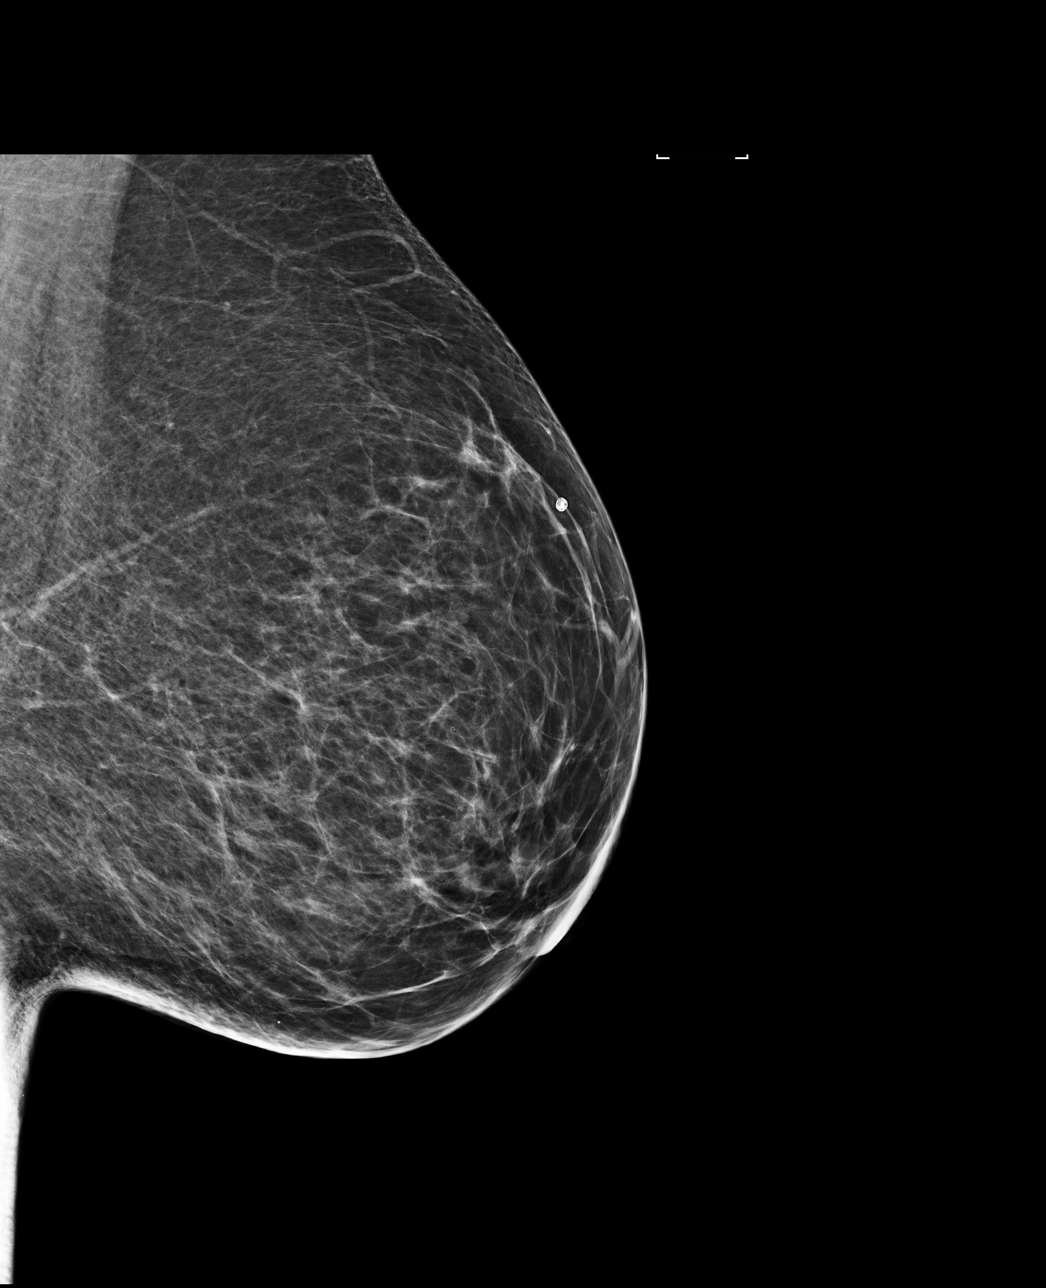

[4 of 4 positions shown; findings below may reference images not displayed]

ACR Breast Density Category b: There are scattered areas of
fibroglandular density.
FINDINGS: There are no findings suspicious for malignancy. Images were
processed with CAD.
IMPRESSION: No mammographic evidence of malignancy. A result letter of this
screening mammogram will be mailed directly to the patient.

RECOMMENDATION:
Screening mammogram in one year. (Code:[US])

BI-RADS CATEGORY  1: Negative.

## 2018-09-19 ENCOUNTER — Other Ambulatory Visit: Payer: Self-pay | Admitting: Family Medicine

## 2018-09-19 DIAGNOSIS — Z1231 Encounter for screening mammogram for malignant neoplasm of breast: Secondary | ICD-10-CM

## 2018-09-25 ENCOUNTER — Ambulatory Visit
Admission: RE | Admit: 2018-09-25 | Discharge: 2018-09-25 | Disposition: A | Discharge status: Home / Self Care | Payer: BLUE CROSS/BLUE SHIELD | Source: Ambulatory Visit | Attending: Family Medicine | Admitting: Family Medicine

## 2018-09-25 ENCOUNTER — Encounter (INDEPENDENT_AMBULATORY_CARE_PROVIDER_SITE_OTHER): Payer: Self-pay

## 2018-09-25 DIAGNOSIS — Z1231 Encounter for screening mammogram for malignant neoplasm of breast: Secondary | ICD-10-CM | POA: Diagnosis present

## 2018-09-25 IMAGING — MG MM DIGITAL SCREENING BILAT W/ TOMO W/ CAD
8 of 13 series · 8 of 29 positions shown · non-contrast
Comparison: Previous exam(s).

CLINICAL DATA: Screening.

EXAM:
DIGITAL SCREENING BILATERAL MAMMOGRAM WITH TOMO AND CAD

[R MLO synth-2D]
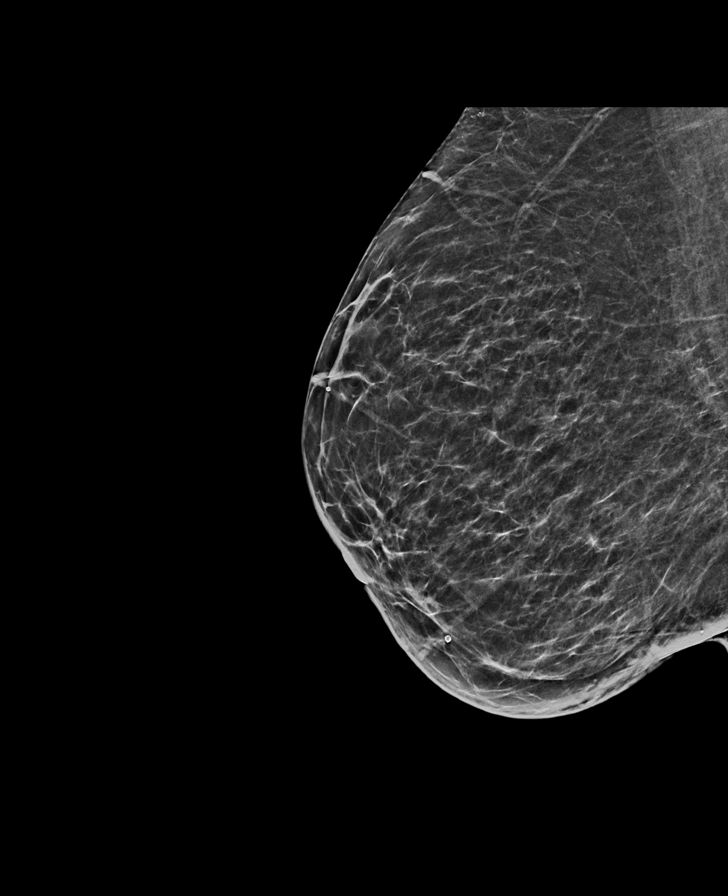

[R CC synth-2D]
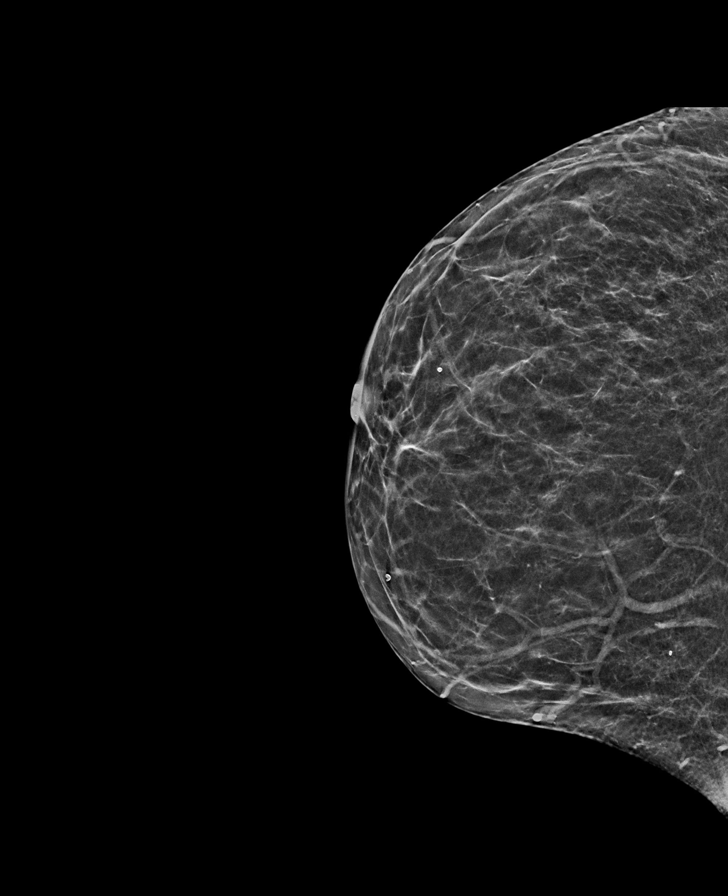

[L CC synth-2D]
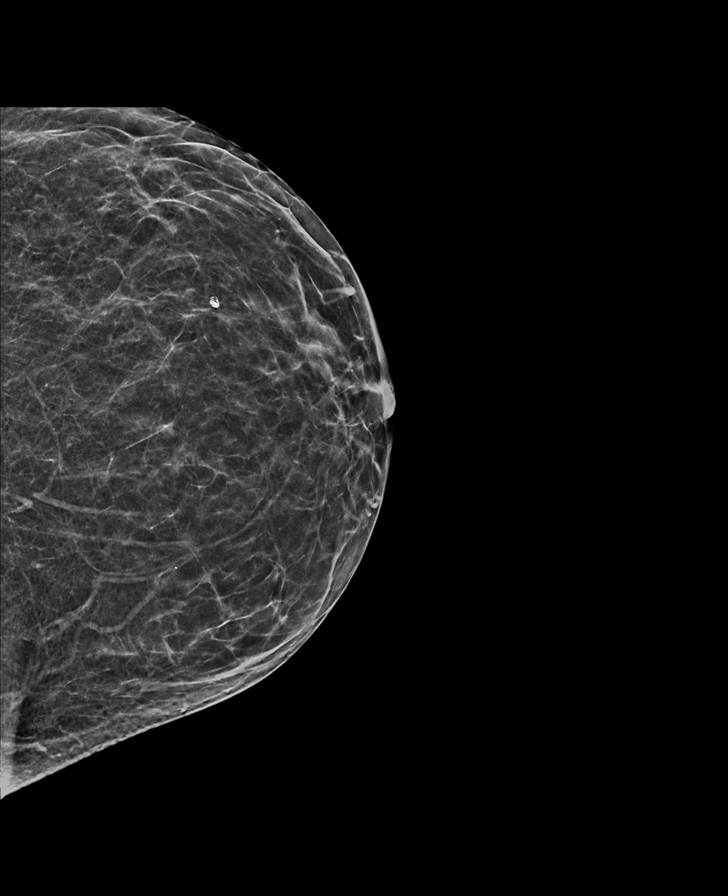

[L MLO synth-2D]
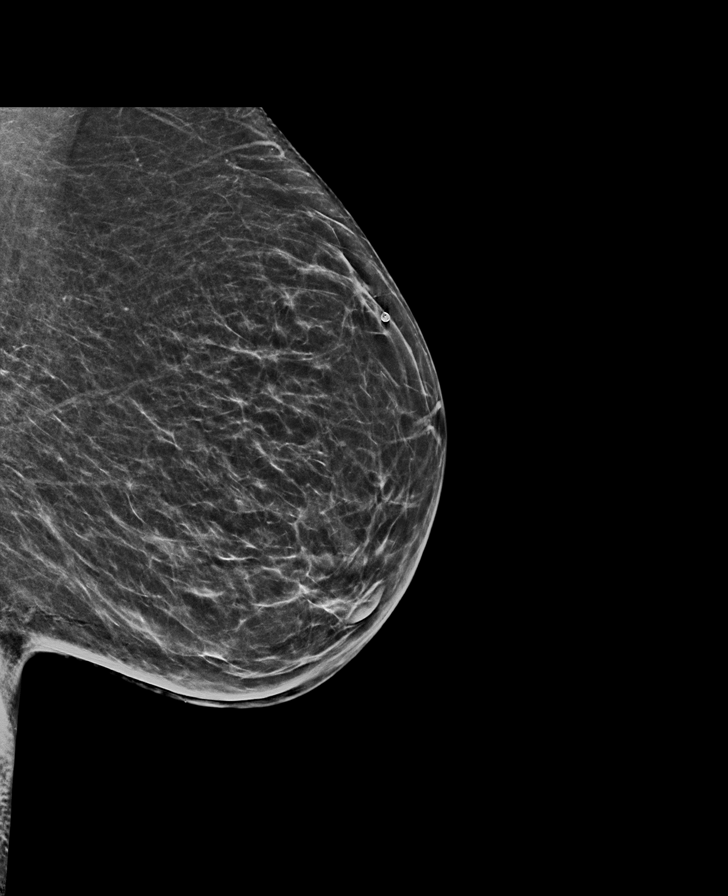

[R CC]
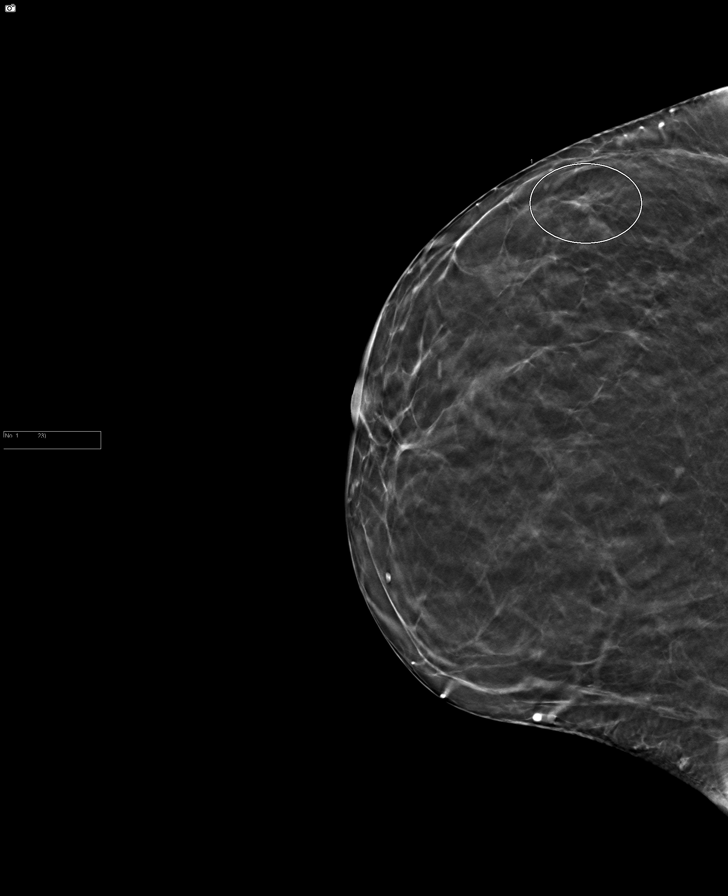

[L CC (1 of 2)]
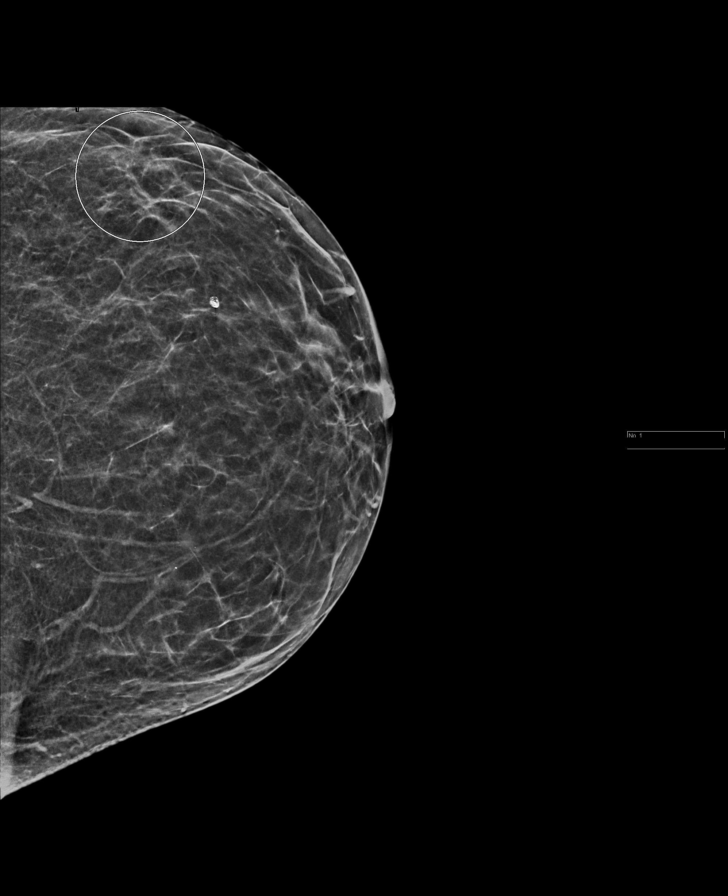

[L CC (2 of 2)]
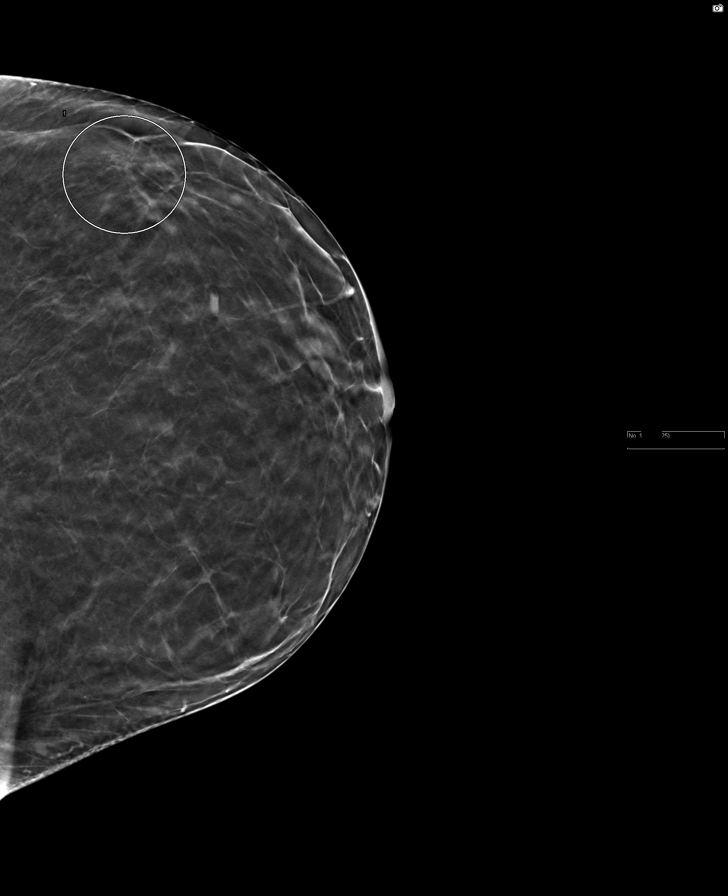

[L MLO]
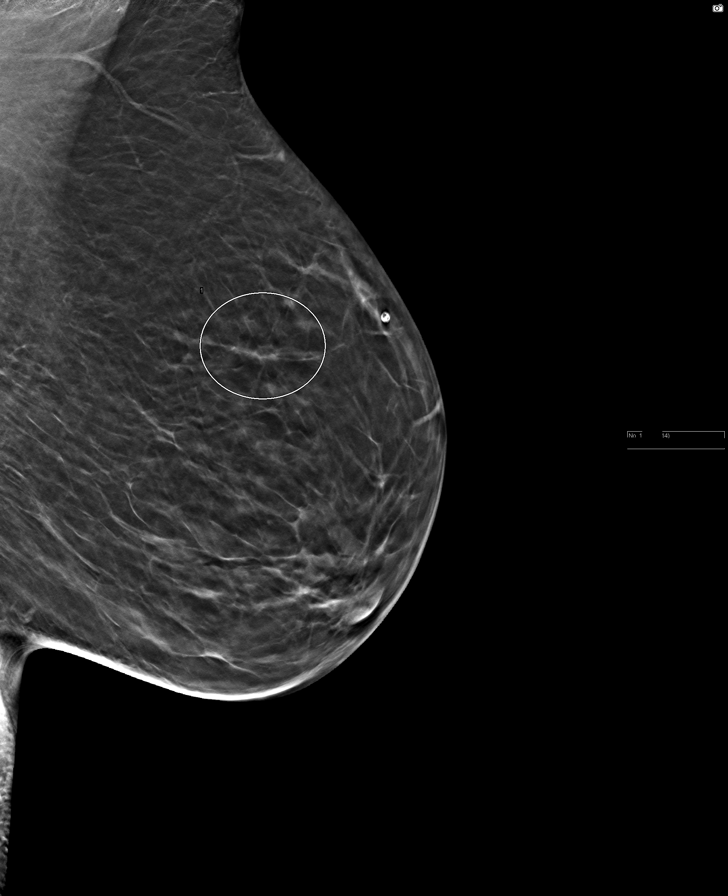

[8 of 29 positions shown; findings below may reference images not displayed]

ACR Breast Density Category b: There are scattered areas of
fibroglandular density.
FINDINGS: In the right breast , a possible distortion requires further
evaluation. This possible distortion is seen within the outer RIGHT
breast on the CC view, tomosynthesis slice 23.

In the left breast , possible distortion requires further
evaluation. This possible distortion is seen within the upper LEFT
breast on the MLO view, slice 14, possible correlate within the
outer LEFT breast on the CC view.

Images were processed with CAD.
IMPRESSION: Further evaluation is suggested for possible distortion in the right
breast.

Further evaluation is suggested for possible distortion in the left
breast.

RECOMMENDATION:
Diagnostic mammogram and possibly ultrasound of both breasts.
(Code:[SY])

The patient will be contacted regarding the findings, and additional
imaging will be scheduled.

BI-RADS CATEGORY  0: Incomplete. Need additional imaging evaluation
and/or prior mammograms for comparison.

## 2018-10-08 ENCOUNTER — Other Ambulatory Visit: Payer: Self-pay | Admitting: Family Medicine

## 2018-10-08 DIAGNOSIS — R928 Other abnormal and inconclusive findings on diagnostic imaging of breast: Secondary | ICD-10-CM

## 2018-10-11 ENCOUNTER — Ambulatory Visit
Admission: RE | Admit: 2018-10-11 | Discharge: 2018-10-11 | Disposition: A | Discharge status: Home / Self Care | Payer: BLUE CROSS/BLUE SHIELD | Source: Ambulatory Visit | Attending: Family Medicine | Admitting: Family Medicine

## 2018-10-11 DIAGNOSIS — R928 Other abnormal and inconclusive findings on diagnostic imaging of breast: Secondary | ICD-10-CM

## 2018-10-11 IMAGING — MG MM DIGITAL DIAGNOSTIC BILAT W/ TOMO W/ CAD
6 of 10 series · 6 of 30 positions shown · non-contrast
Comparison: Previous exam(s).

CLINICAL DATA: Patient presents for additional views of both
breasts to evaluate possible distortion on recent screening exam.

EXAM:
DIGITAL DIAGNOSTIC BILATERAL MAMMOGRAM WITH CAD AND TOMO

[L MLO synth-2D]
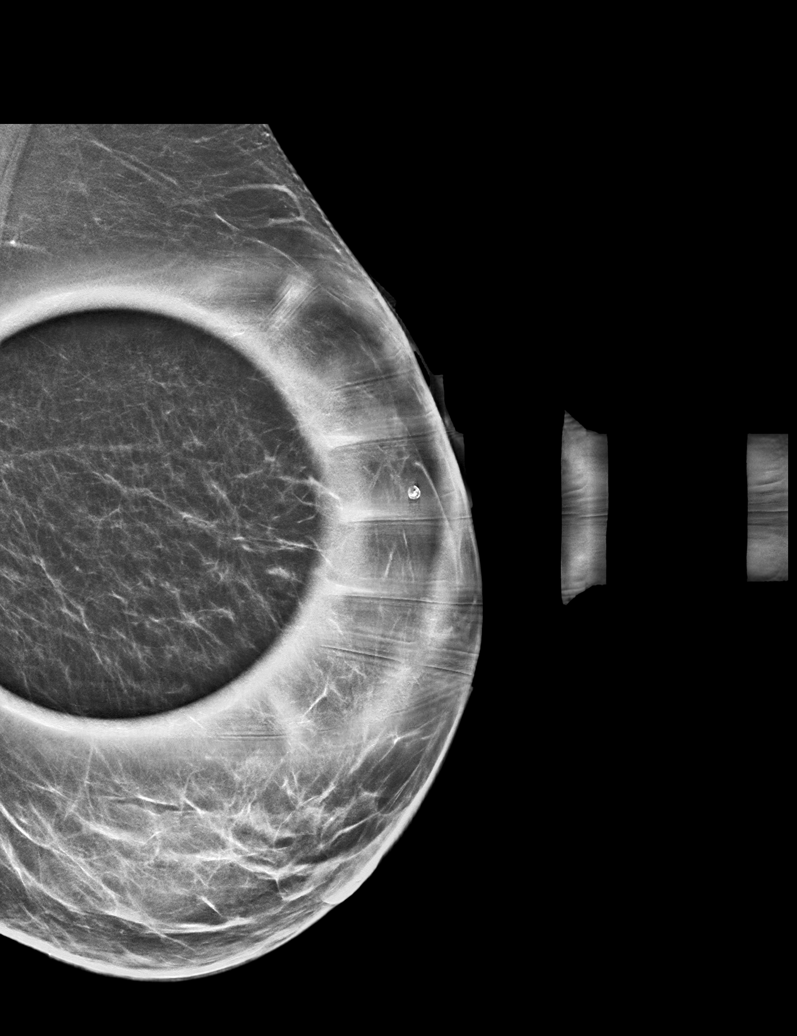

[R ML synth-2D]
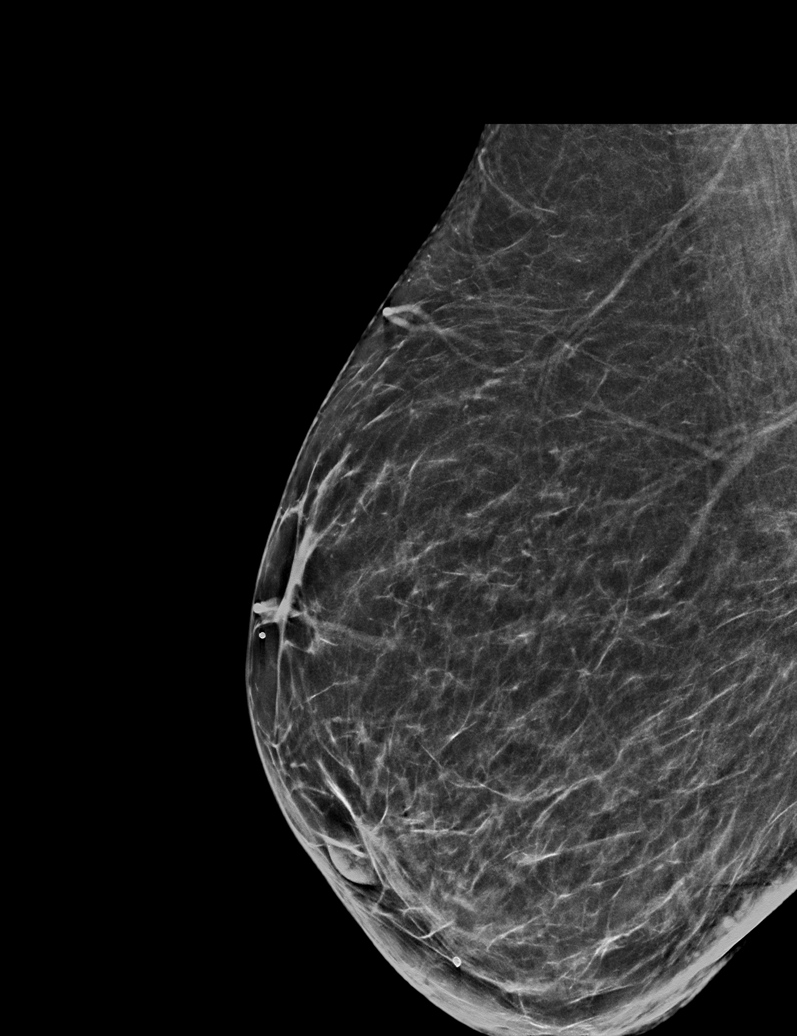

[R CC synth-2D (1 of 2)]
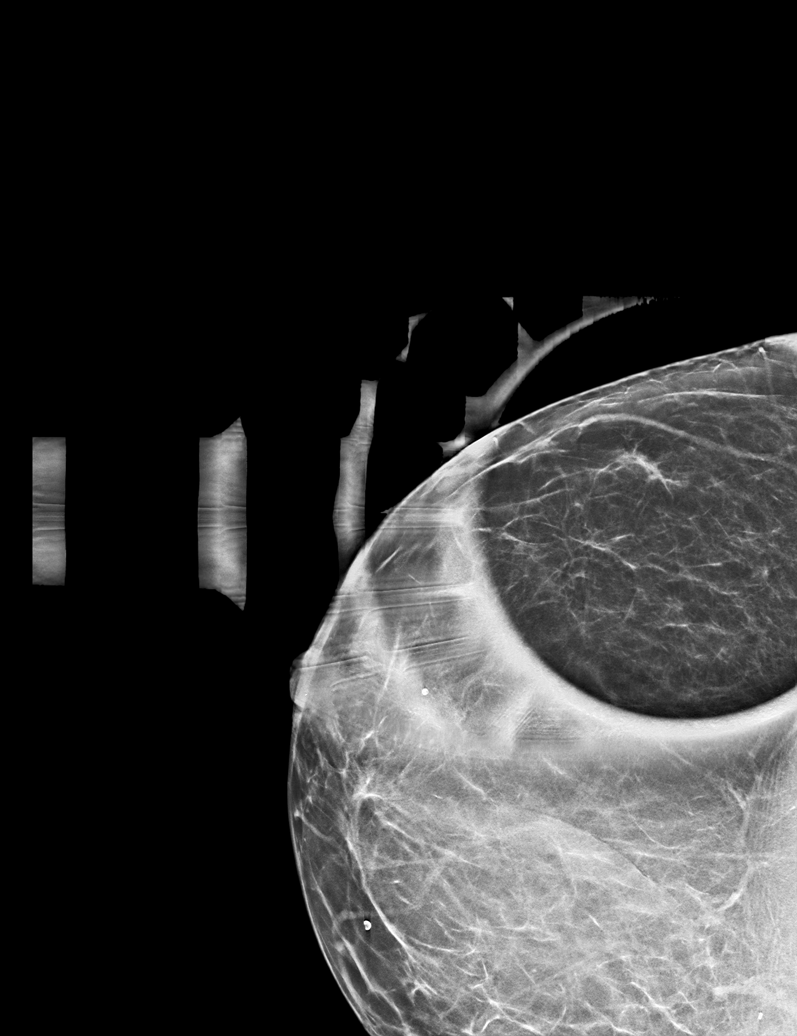

[L CC synth-2D]
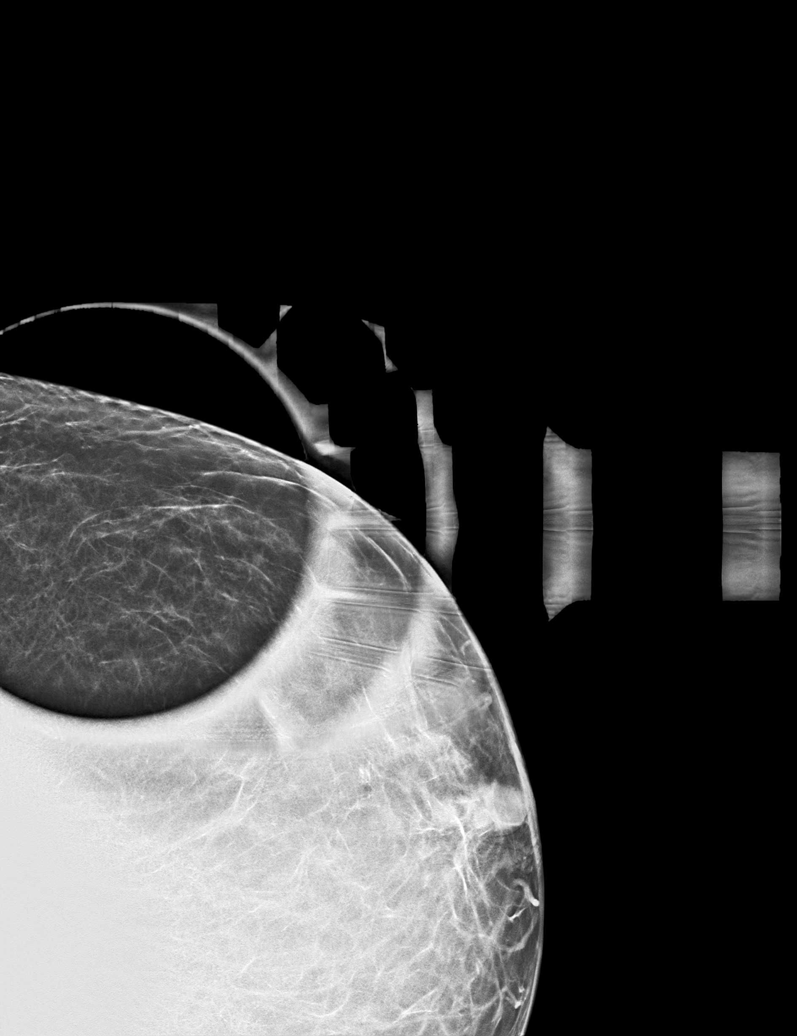

[R CC synth-2D (2 of 2)]
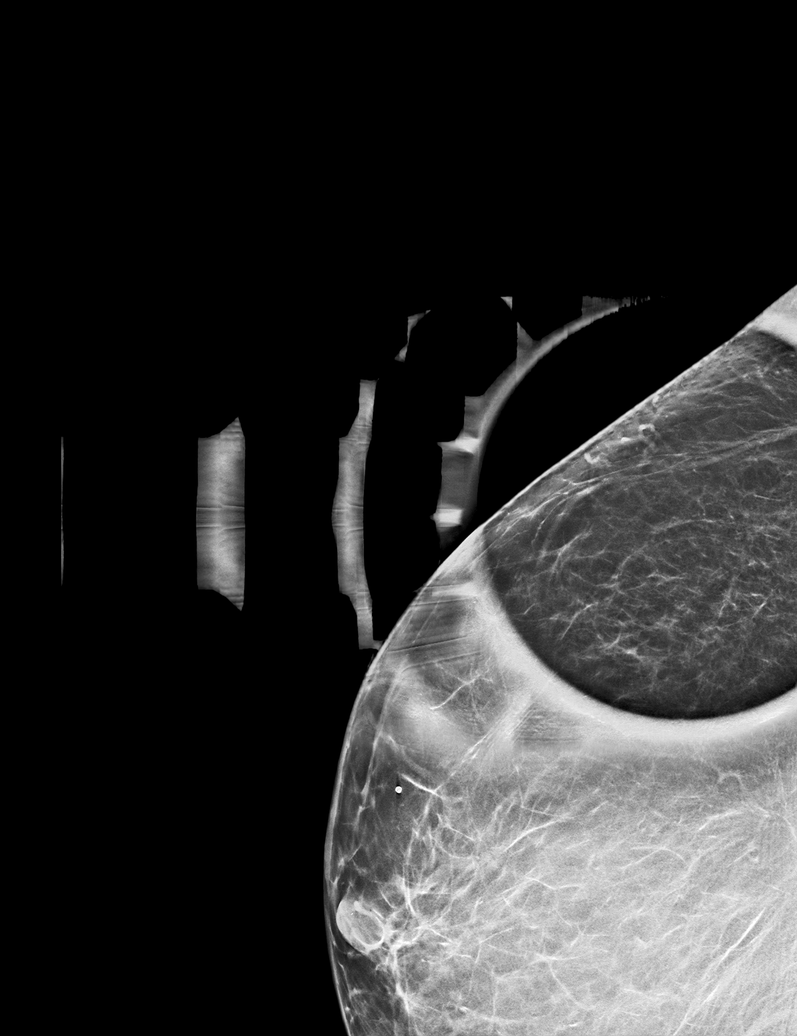

[L CC tomo · tomo slice 23/46.0]
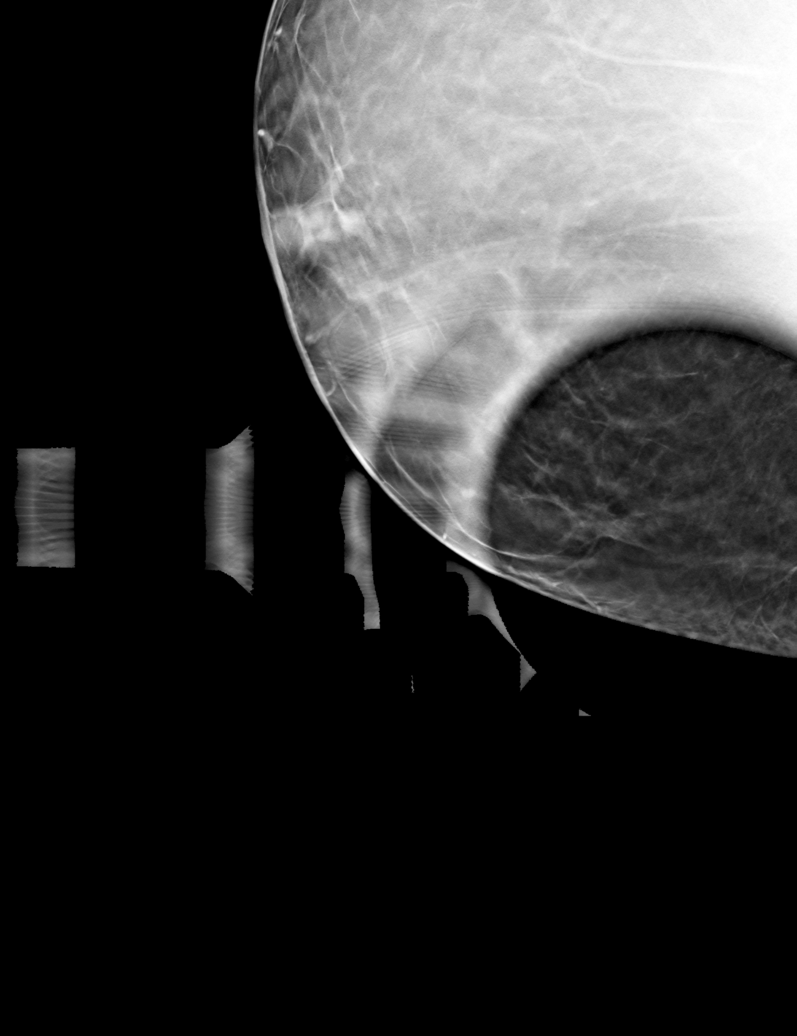

[6 of 30 positions shown; findings below may reference images not displayed]

ACR Breast Density Category b: There are scattered areas of
fibroglandular density.
FINDINGS: Additional images of both breasts were obtained with tomographic
imaging. Exam demonstrates no focal distortion over the outer right
breast and no focal distortion over the upper outer left breast.

Mammographic images were processed with CAD.
IMPRESSION: No focal distortion within either breast.

RECOMMENDATION:
Recommend continued annual bilateral screening mammographic
follow-up.

I have discussed the findings and recommendations with the patient.
Results were also provided in writing at the conclusion of the
visit. If applicable, a reminder letter will be sent to the patient
regarding the next appointment.

BI-RADS CATEGORY  1: Negative.

## 2019-08-29 ENCOUNTER — Other Ambulatory Visit: Payer: Self-pay

## 2019-08-29 ENCOUNTER — Ambulatory Visit
Admission: EM | Admit: 2019-08-29 | Discharge: 2019-08-29 | Disposition: A | Discharge status: Home / Self Care | Payer: BC Managed Care – PPO

## 2019-08-29 DIAGNOSIS — R220 Localized swelling, mass and lump, head: Secondary | ICD-10-CM

## 2019-08-29 DIAGNOSIS — T50905A Adverse effect of unspecified drugs, medicaments and biological substances, initial encounter: Secondary | ICD-10-CM | POA: Diagnosis not present

## 2019-08-29 DIAGNOSIS — T783XXA Angioneurotic edema, initial encounter: Secondary | ICD-10-CM

## 2019-08-29 MED ORDER — FAMOTIDINE 20 MG PO TABS
20.0000 mg | ORAL_TABLET | Freq: Once | ORAL | Status: AC
  Administered 2019-08-29: 09:00:00 20 mg via ORAL

## 2019-08-29 MED ORDER — FAMOTIDINE 20 MG PO TABS: 20.0000 mg | ORAL_TABLET | Freq: Two times a day (BID) | ORAL | 0 refills | Status: AC

## 2019-08-29 MED ORDER — DIPHENHYDRAMINE HCL 50 MG PO CAPS
50.0000 mg | ORAL_CAPSULE | Freq: Once | ORAL | Status: AC
  Administered 2019-08-29: 50 mg via ORAL

## 2019-08-29 MED ORDER — DIPHENHYDRAMINE HCL 25 MG PO TABS: 25.0000 mg | ORAL_TABLET | Freq: Four times a day (QID) | ORAL | 0 refills | Status: DC | PRN

## 2019-08-29 MED ORDER — PREDNISONE 20 MG PO TABS: 40.0000 mg | ORAL_TABLET | Freq: Every day | ORAL | 0 refills | Status: DC

## 2019-08-29 MED ORDER — EPINEPHRINE 0.3 MG/0.3ML IJ SOAJ: 0.3000 mg | INTRAMUSCULAR | 0 refills | Status: AC | PRN

## 2019-08-29 MED ORDER — METHYLPREDNISOLONE SODIUM SUCC 125 MG IJ SOLR
125.0000 mg | Freq: Once | INTRAMUSCULAR | Status: AC
  Administered 2019-08-29: 09:00:00 125 mg via INTRAMUSCULAR

## 2019-08-29 NOTE — ED Triage Notes (Addendum)
Patient states that she started noticing tongue swelling last night. Patient states that she has not ate any new foods or medications. Patient states that the swelling has gone down mostly.

## 2019-08-29 NOTE — Discharge Instructions (Signed)
It was very nice seeing you today in clinic. Thank you for entrusting me with your care.   STOP lisinopril. Call your regular doctor when you get home today to discuss alternative.   I am sending in several prescriptions for you Benadryl 25 mg every 6 hours as needed Prednisone 40 mg daily x 4 days Pepcid 20 mg TWICE a day for 4 days Epi pen for as needed use.  If your symptoms/condition worsens, please seek follow up care either here or in the ER. Please remember, our [facility] providers are "right here with you" when you need [place].   Again, it was my pleasure to take care of you today. Thank you for choosing our clinic. I hope that you start to feel better quickly.   Honor Loh, MSN, APRN, FNP-C, CEN Advanced Practice Provider [facility]

## 2019-08-29 NOTE — ED Provider Notes (Signed)
[place], [place]   Name: Tiffany Barajas DOB: 1961/06/04 MRN: [id number] CSN: [id number] PCP: System, Provider Not In  Arrival date and time:  08/29/19 0815  Chief Complaint:  Allergic Reaction   NOTE: Prior to seeing the patient today, I have reviewed the triage nursing documentation and vital signs. Clinical staff has updated patient's PMH/PSHx, current medication list, and drug allergies/intolerances to ensure comprehensive history available to assist in medical decision making.   History:   HPI: Tiffany Barajas is a 58 y.o. female who presents today with complaints of tongue swelling that began with acute onset last night at around 1900. Patient denies any new medications, foods, soaps/body washes, laundry detergents, or cosmetics. She denies a past medical history significant for environmental allergies. She denies any associated rash. Initially, symptoms were minor and patient thought that she had simply bitten her tongue so she went to bed and "didn't think much of it". Patient woke at 0245 this morning with a more pronounced episode of tongue swelling. This episode was associated with difficulty swallowing. She denies shortness of breath. Patient with a photo that she took of her early morning tongue swelling; swelling was certainly significant. Patient was unable to return to sleep. She advises that she sent her PCP a text message at 0400 this morning and was advised to go to the ED for evaluation and treatment. Despite recommendations, patient did not go to the ED citing the fact that it was "too expensive".   Patient presents this morning having not taken any medications for her symptoms. She notes that her tongue still feels swollen, but not as bad as it was during the night. Tongue feels "sore". She denies SOB and dysphagia at the current time. SPO2 on RA was 98%. Patient is seated calmly in the exam room with NAD noted. In review of her medication list, patient found to be on ACEi  therapy (lisinopril). She has not taken her dose today.   History reviewed. No pertinent past medical history.  Past Surgical History:  Procedure Laterality Date  . ABDOMINAL HYSTERECTOMY      Family History  Problem Relation Age of Onset  . Breast cancer Cousin 30       pat cousin  . Atrial fibrillation Mother   . Diabetes Mother   . Hypertension Mother   . Cancer Father   . Heart disease Father   . Stroke Father     Social History   Tobacco Use  . Smoking status: Never Smoker  . Smokeless tobacco: Never Used  Substance Use Topics  . Alcohol use: Yes    Comment: wine daily  . Drug use: Never    There are no active problems to display for this patient.   Home Medications:    Current Meds  Medication Sig  . buPROPion (WELLBUTRIN XL) 150 MG 24 hr tablet TAKE 1 TABLET EVERY MORNING FOR 7 DAYS , THEN INCREASE TO TAKE 2 TABLETS EVERY MORNING  . busPIRone (BUSPAR) 5 MG tablet TAKE 3 TABLET BY MOUTH TWICE A DAY  . cyanocobalamin (,VITAMIN B-12,) 1000 MCG/ML injection INJECT 1 ML EVERY MONTH BY SUBCUTANEOUS ROUTE AS DIRECTED FOR 30 DAYS.  . DULoxetine (CYMBALTA) 60 MG capsule Take 120 mg by mouth daily.  Marland Kitchen estradiol (ESTRACE) 2 MG tablet Take 2 mg by mouth daily.  Marland Kitchen lisinopril (ZESTRIL) 20 MG tablet Take 20 mg by mouth daily.  . methocarbamol (ROBAXIN) 500 MG tablet Take 500 mg by mouth every 12 (twelve) hours as  needed.  Marland Kitchen. oxybutynin (DITROPAN-XL) 10 MG 24 hr tablet Take 10 mg by mouth daily.  . OXYCONTIN 20 MG 12 hr tablet TAKE 1 TABLET EVERY 12 HOURS TBD 08/25/2019  . traZODone (DESYREL) 100 MG tablet Take 200 mg by mouth at bedtime.    Allergies:   Morphine  Review of Systems (ROS): Review of Systems  Constitutional: Negative for chills and fever.  HENT: Positive for trouble swallowing (during the night; resolved now). Negative for drooling.        (+) tongue swelling  Respiratory: Negative for apnea, cough, choking, chest tightness, shortness of breath,  wheezing and stridor.   Cardiovascular: Negative for chest pain and palpitations.  Gastrointestinal: Negative for nausea and vomiting.  Skin: Negative for color change, pallor and rash.  Neurological: Negative for dizziness, syncope, weakness, numbness and headaches.  All other systems reviewed and are negative.    Vital Signs: Today's Vitals   08/29/19 0837 08/29/19 0838 08/29/19 0840 08/29/19 0952  BP:   (!) 141/78   Pulse:   80   Resp:   17   Temp:   98.4 F (36.9 C)   TempSrc:   Oral   SpO2:   98%   Weight:  150 lb (68 kg)    Height:  4\' 11"  (1.499 m)    PainSc: 4    4     Physical Exam: Physical Exam  Constitutional: She is oriented to person, place, and time and well-developed, well-nourished, and in no distress.  HENT:  Head: Normocephalic and atraumatic.  Mouth/Throat: Uvula is midline and mucous membranes are normal. No uvula swelling. No posterior oropharyngeal edema or posterior oropharyngeal erythema.  (+) slight tongue swelling; able to handle oral secretions. No speech changes. Consumes fluids without difficulties. No SOB, wheezing, or stridor. No lip swelling.   Eyes: Pupils are equal, round, and reactive to light. EOM are normal.  Neck: Normal range of motion. Neck supple. No tracheal deviation present.  Cardiovascular: Normal rate, regular rhythm, normal heart sounds and intact distal pulses. Exam reveals no gallop and no friction rub.  No murmur heard. Pulmonary/Chest: Effort normal and breath sounds normal. No respiratory distress. She has no wheezes. She has no rales.  Neurological: She is alert and oriented to person, place, and time. Gait normal.  Skin: Skin is warm and dry. No rash noted.  Psychiatric: Mood, memory, affect and judgment normal.  Nursing note and vitals reviewed.   Urgent Care Treatments / Results:   LABS: PLEASE NOTE: all labs that were ordered this encounter are listed, however only abnormal results are displayed. Labs Reviewed - No  data to display  EKG: -None  RADIOLOGY: No results found.  PROCEDURES: Procedures  MEDICATIONS RECEIVED THIS VISIT: Medications  diphenhydrAMINE (BENADRYL) capsule 50 mg (50 mg Oral Given 08/29/19 0919)  famotidine (PEPCID) tablet 20 mg (20 mg Oral Given 08/29/19 0918)  methylPREDNISolone sodium succinate (SOLU-MEDROL) 125 mg/2 mL injection 125 mg (125 mg Intramuscular Given 08/29/19 0920)    PERTINENT CLINICAL COURSE NOTES/UPDATES:   Initial Impression / Assessment and Plan / Urgent Care Course:  Pertinent labs & imaging results that were available during my care of the patient were personally reviewed by me and considered in my medical decision making (see lab/imaging section of note for values and interpretations).  Tiffany Barajas is a 58 y.o. female who [facility] with complaints of Allergic Reaction   Patient is well appearing overall in clinic today. She does not  appear to be in any acute distress. Presenting symptoms (see HPI) and exam as documented above. Patient treated for tongue swelling that is felt to be an angioedema reaction related to ACEi therapy (lisinopril). She was given SoluMedrol IM and oral H1 (diphenhydramine) and H2 (famotidine) blockers in clinic, which fully resolved her symptoms after about 30 minutes of monitoring in clinic. Patient advised to hold lisinopril as it is the likely inciting factor causing her symptoms last night and this morning. She was encouraged to call her PCP after clinic today to discuss alternatives. Patient is supposed to be leaving for the beach today. Discussed continued treatment over the next 4 days with prednisone and the aforementioned histamine blockers. Additionally, I have called in a prescription for an Epipen for patient to have on hand while out of town.   Discussed her need to have a low threshold for presenting for emergent care for any recurrence, as her next reaction could be more severe. I have  reviewed the follow up and strict return precautions for any new or worsening symptoms. Patient is aware of symptoms that would be deemed urgent/emergent, and would thus require further evaluation either here or in the emergency department. At the time of discharge, she verbalized understanding and consent with the discharge plan as it was reviewed with her. All questions were fielded by provider and/or clinic staff prior to patient discharge.    Final Clinical Impressions / Urgent Care Diagnoses:   Final diagnoses:  Angioedema, initial encounter  Drug reaction, initial encounter    New Prescriptions:  [place] Controlled Substance Registry consulted? Not Applicable  Meds ordered this encounter  Medications  . diphenhydrAMINE (BENADRYL) capsule 50 mg  . famotidine (PEPCID) tablet 20 mg  . methylPREDNISolone sodium succinate (SOLU-MEDROL) 125 mg/2 mL injection 125 mg  . famotidine (PEPCID) 20 MG tablet    Sig: Take 1 tablet (20 mg total) by mouth 2 (two) times daily.    Dispense:  8 tablet    Refill:  0  . diphenhydrAMINE (BENADRYL) 25 MG tablet    Sig: Take 1 tablet (25 mg total) by mouth every 6 (six) hours as needed.    Dispense:  20 tablet    Refill:  0  . predniSONE (DELTASONE) 20 MG tablet    Sig: Take 2 tablets (40 mg total) by mouth daily.    Dispense:  8 tablet    Refill:  0  . EPINEPHrine 0.3 mg/0.3 mL IJ SOAJ injection    Sig: Inject 0.3 mLs (0.3 mg total) into the muscle as needed for anaphylaxis.    Dispense:  1 each    Refill:  0    Recommended Follow up Care:  Patient encouraged to follow up with the following provider within the specified time frame, or sooner as dictated by the severity of her symptoms. As always, she was instructed that for any urgent/emergent care needs, she should seek care either here or in the emergency department for more immediate evaluation.  Follow-up Information    Call  PCP.   Why: Call today to discuss alternative for lisinopril.         NOTE: This note was prepared using Scientist, clinical (histocompatibility and immunogenetics) along with smaller Lobbyist. Despite my best ability to proofread, there is the potential that transcriptional errors may still occur from this process, and are completely unintentional.     Verlee Monte, NP 08/30/19 [phone]

## 2019-10-02 ENCOUNTER — Other Ambulatory Visit: Payer: Self-pay | Admitting: Family Medicine

## 2019-10-02 DIAGNOSIS — Z1231 Encounter for screening mammogram for malignant neoplasm of breast: Secondary | ICD-10-CM

## 2019-10-09 ENCOUNTER — Ambulatory Visit
Admission: RE | Admit: 2019-10-09 | Discharge: 2019-10-09 | Disposition: A | Discharge status: Home / Self Care | Payer: BC Managed Care – PPO | Source: Ambulatory Visit | Attending: Family Medicine | Admitting: Family Medicine

## 2019-10-09 ENCOUNTER — Other Ambulatory Visit: Payer: Self-pay

## 2019-10-09 DIAGNOSIS — Z1231 Encounter for screening mammogram for malignant neoplasm of breast: Secondary | ICD-10-CM | POA: Diagnosis not present

## 2019-10-09 IMAGING — MG DIGITAL SCREENING BILAT W/ TOMO W/ CAD
8 series · 8 of 24 positions shown · non-contrast
Comparison: Previous exam(s).

CLINICAL DATA: Screening.

EXAM:
DIGITAL SCREENING BILATERAL MAMMOGRAM WITH TOMO AND CAD

[L MLO synth-2D]
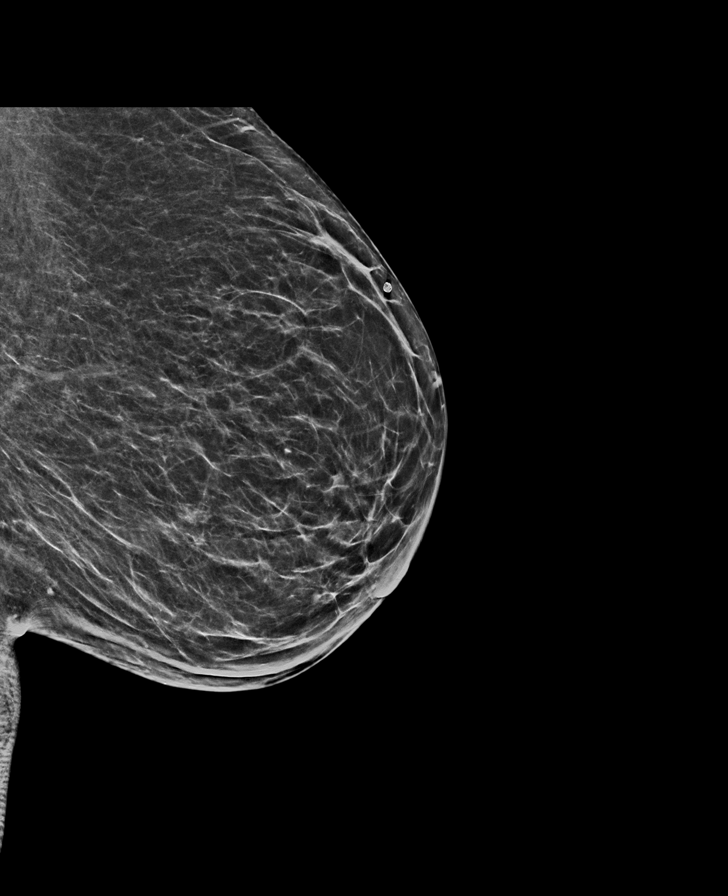

[R MLO synth-2D]
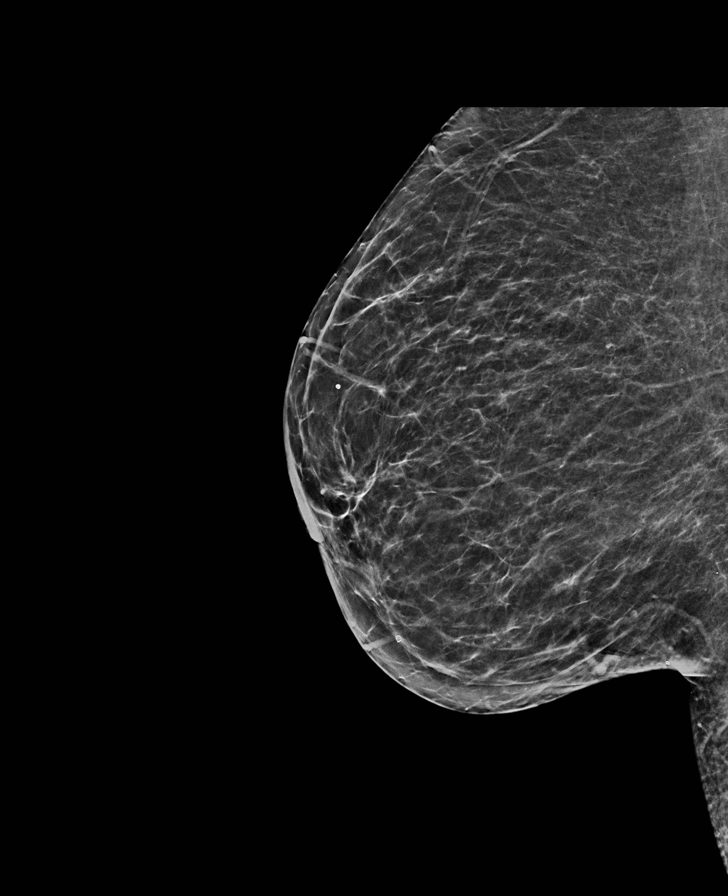

[R CC synth-2D]
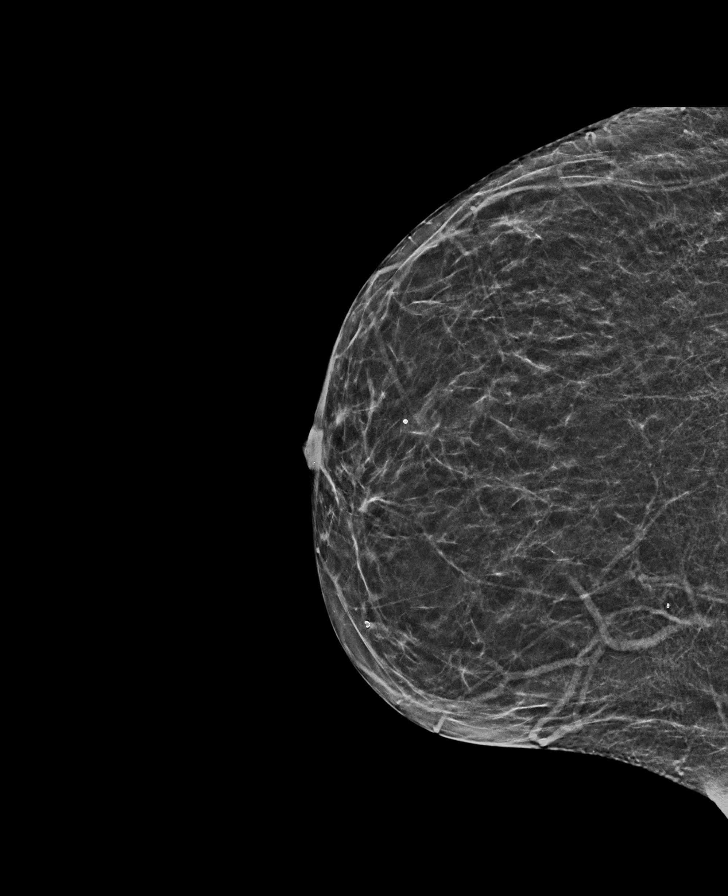

[L CC synth-2D]
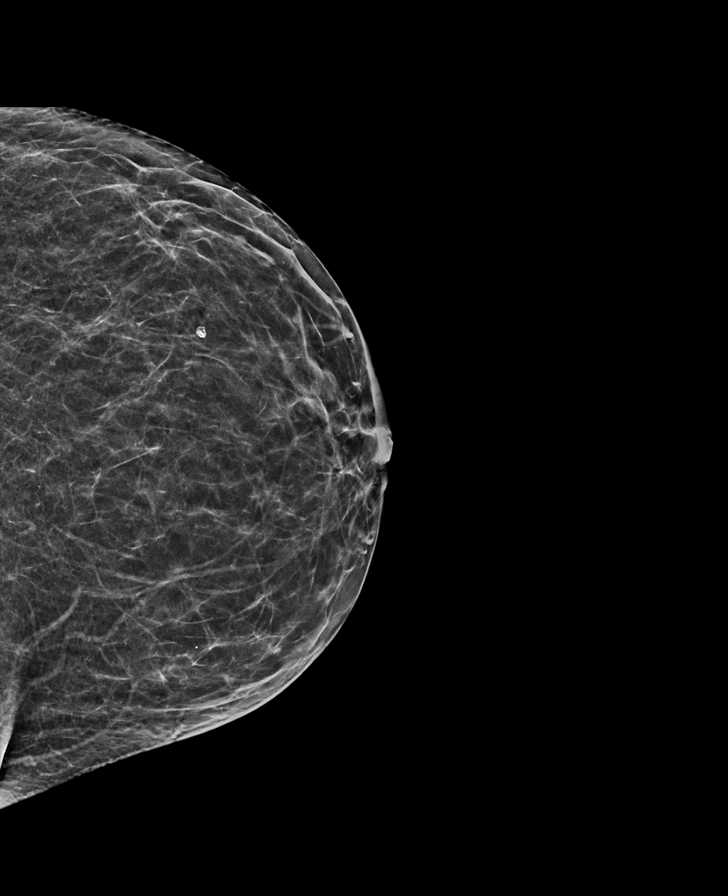

[L CC tomo · tomo slice 27/54.0]
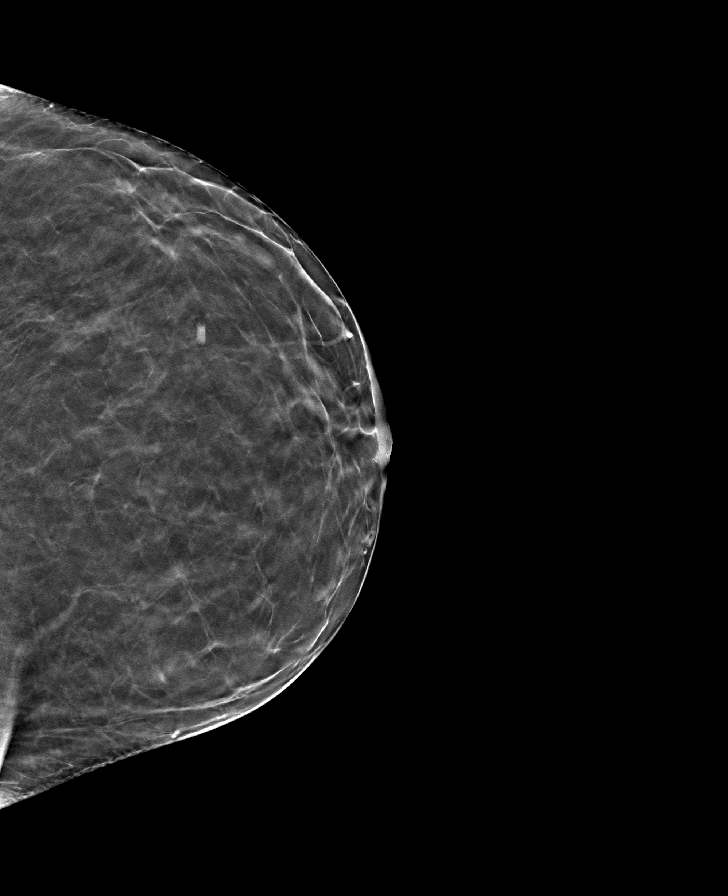

[L MLO tomo · tomo slice 33/64.0]
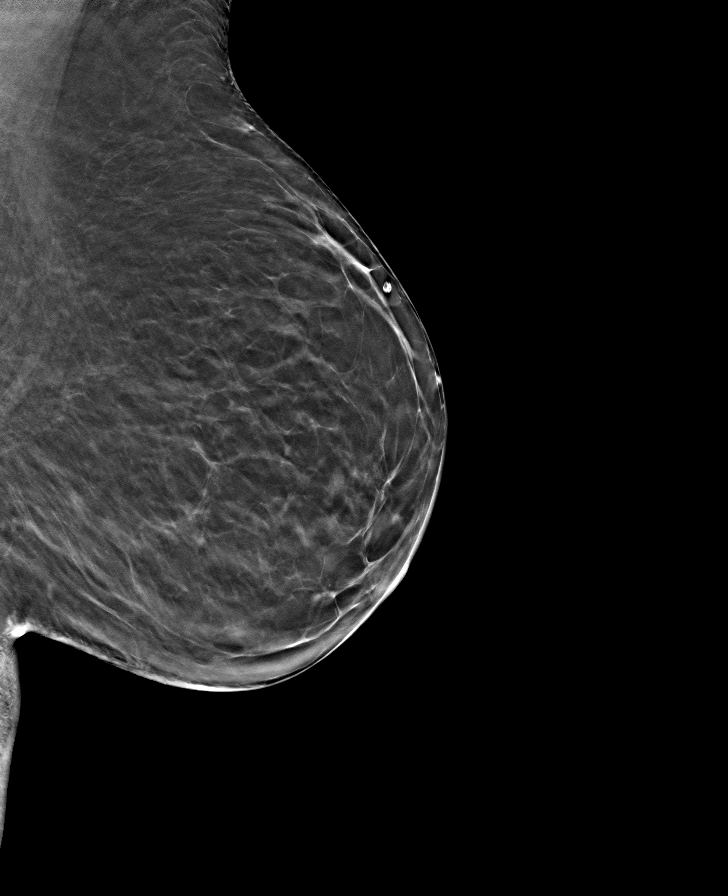

[R MLO tomo · tomo slice 30/59.0]
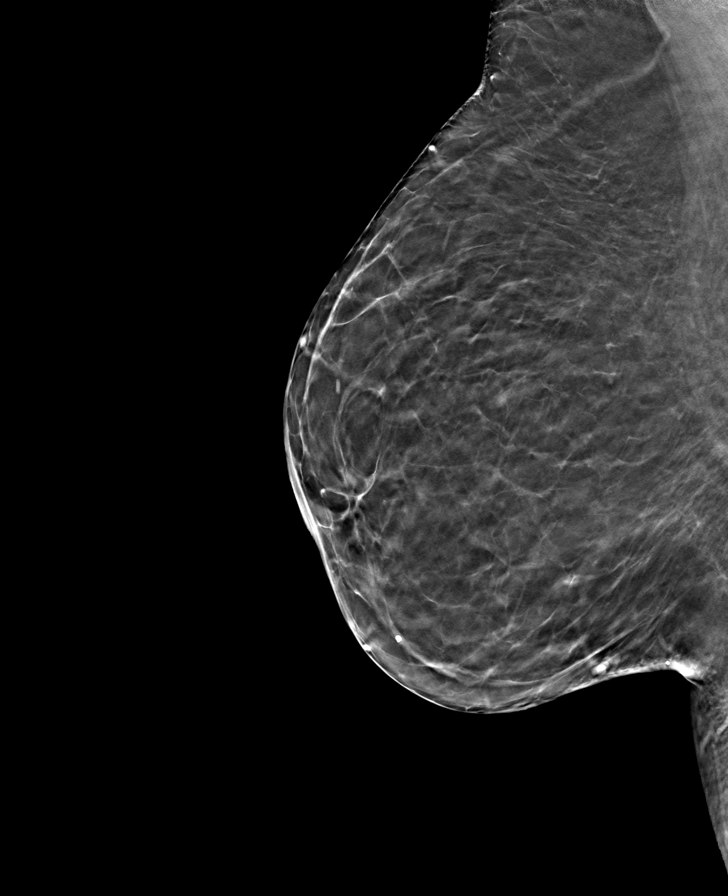

[R CC tomo · tomo slice 27/52.0]
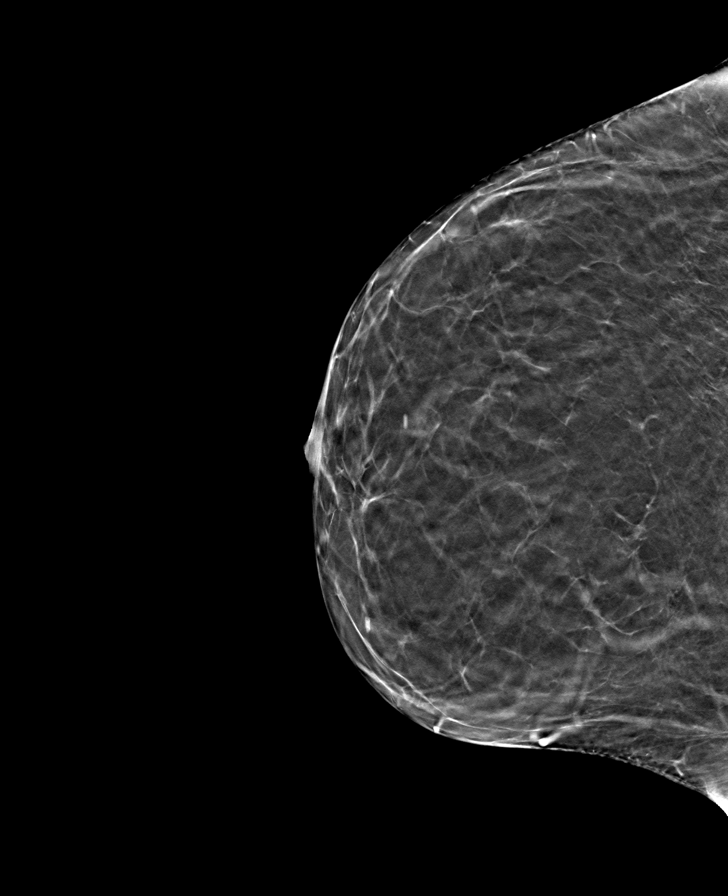

[8 of 24 positions shown; findings below may reference images not displayed]

ACR Breast Density Category b: There are scattered areas of
fibroglandular density.
FINDINGS: There are no findings suspicious for malignancy. Images were
processed with CAD.
IMPRESSION: No mammographic evidence of malignancy. A result letter of this
screening mammogram will be mailed directly to the patient.

RECOMMENDATION:
Screening mammogram in one year. (Code:[TQ])

BI-RADS CATEGORY  1: Negative.

## 2021-01-29 HISTORY — PX: ESOPHAGOGASTRODUODENOSCOPY: SHX1529

## 2021-06-01 ENCOUNTER — Other Ambulatory Visit: Payer: Self-pay | Admitting: Anesthesiology

## 2021-06-01 DIAGNOSIS — G894 Chronic pain syndrome: Secondary | ICD-10-CM

## 2021-06-09 ENCOUNTER — Ambulatory Visit
Admission: RE | Admit: 2021-06-09 | Discharge: 2021-06-09 | Disposition: A | Discharge status: Home / Self Care | Payer: BC Managed Care – PPO | Source: Ambulatory Visit | Attending: Anesthesiology | Admitting: Anesthesiology

## 2021-06-09 ENCOUNTER — Other Ambulatory Visit: Payer: Self-pay

## 2021-06-09 DIAGNOSIS — G894 Chronic pain syndrome: Secondary | ICD-10-CM | POA: Diagnosis present

## 2021-06-09 IMAGING — MR MR CERVICAL SPINE WO/W CM
8 series · 46 of 48 positions shown · IV contrast (gadavist)
Comparison: None.

CLINICAL DATA: Numbness and tingling of the right arm

EXAM:
MRI CERVICAL SPINE WITHOUT AND WITH CONTRAST
TECHNIQUE: Multiplanar and multiecho pulse sequences of the cervical spine, to
include the craniocervical junction and cervicothoracic junction,
were obtained without and with intravenous contrast.
CONTRAST:  6mL GADAVIST GADOBUTROL 1 MMOL/ML IV SOLN

[Series 2: T2 · sagittal · 3.0mm · 0.69mm/px · 4 of 13 slices shown (1 of 2)]
[im 1/13]
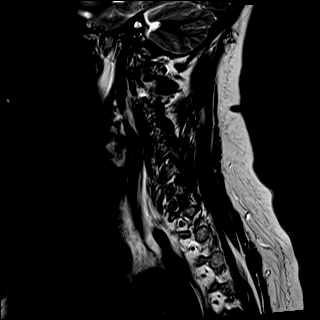
[im 5/13]
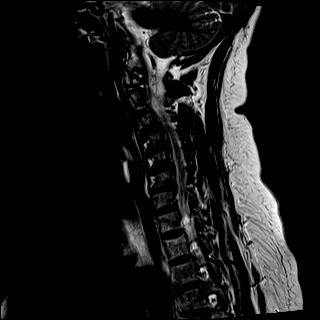
[im 9/13]
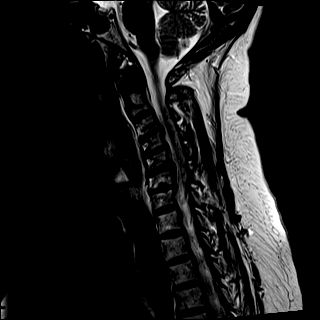
[im 13/13]
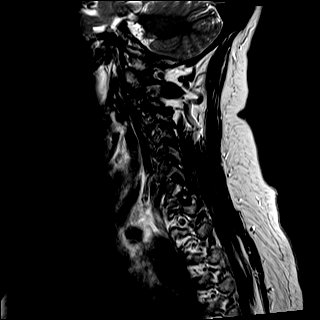

[Series 3: T1 · sagittal · 3.0mm · 0.86mm/px · 4 of 13 slices shown (1 of 2)]
[im 1/13]
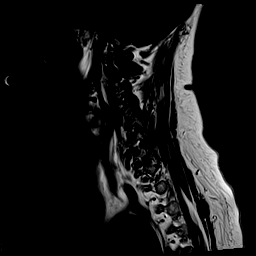
[im 5/13]
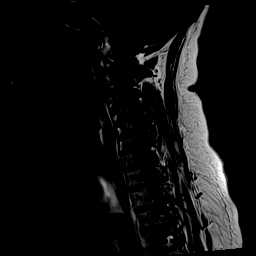
[im 9/13]
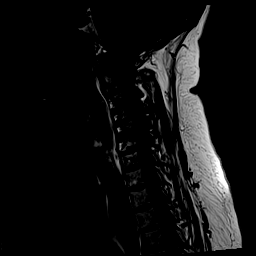
[im 13/13]
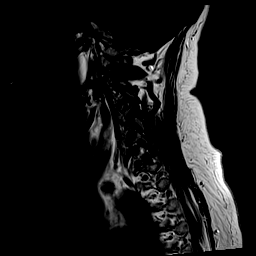

[Series 4: STIR · sagittal · 3.0mm · 0.69mm/px · 4 of 13 slices shown]
[im 1/13]
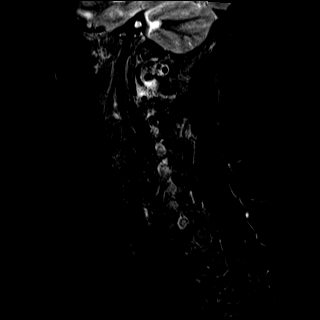
[im 5/13]
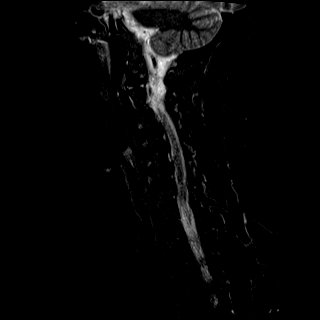
[im 9/13]
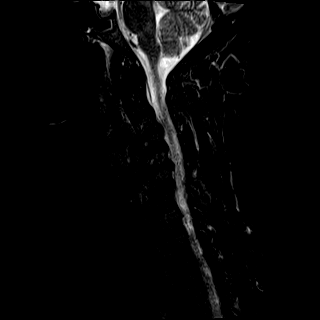
[im 13/13]
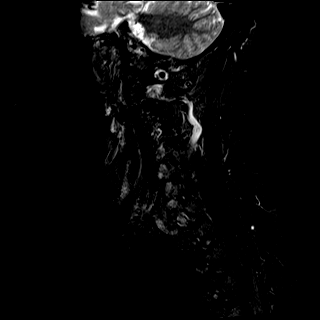

[Series 5: T2 · axial · 3.0mm · 0.62mm/px · z∈[-76,+6]mm · 8 of 23 slices shown (2 of 2)]
[im 1/23]
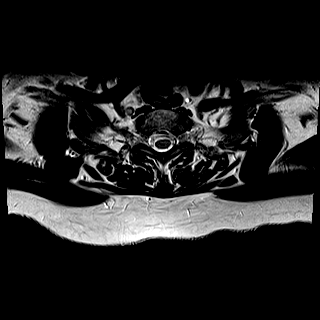
[im 4/23]
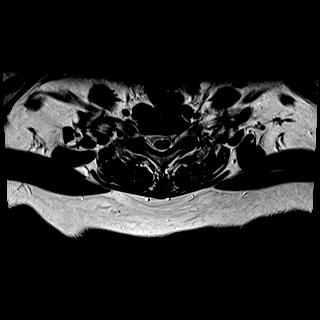
[im 7/23]
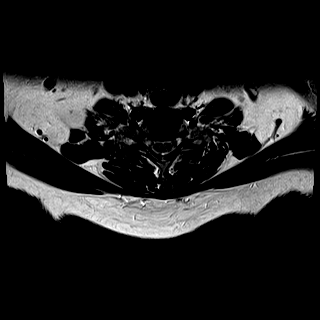
[im 10/23]
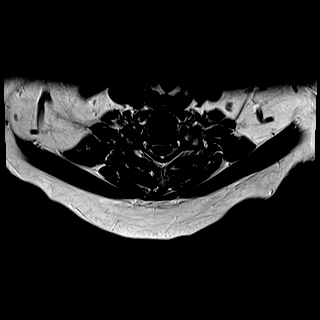
[im 13/23]
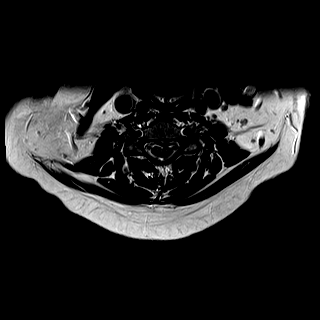
[im 16/23]
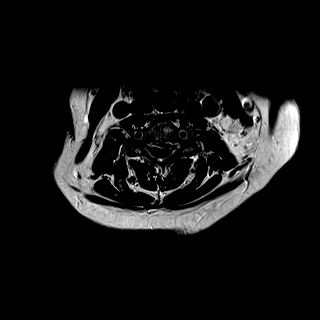
[im 19/23]
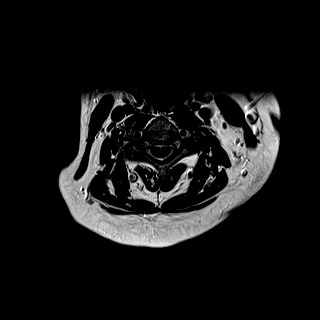
[im 23/23]
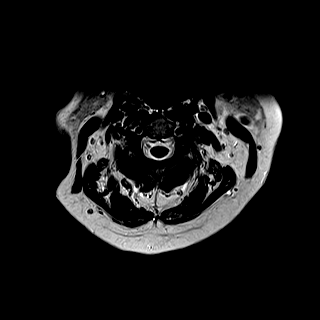

[Series 6: mpgr ax · axial · 3.0mm · 0.35mm/px · z∈[-67,-11]mm · 6 of 23 slices shown]
[im 1/23]
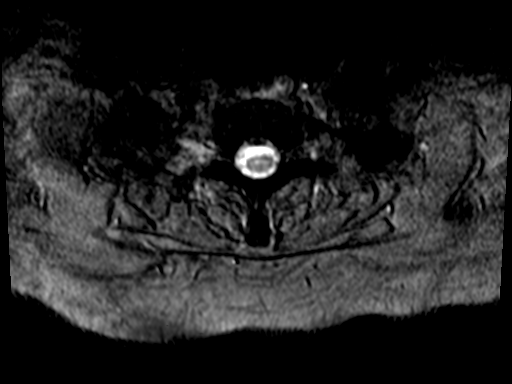
[im 4/23]
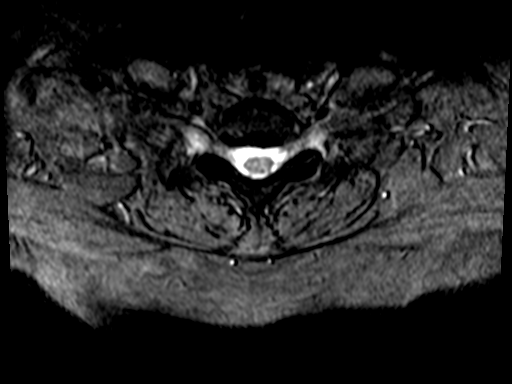
[im 7/23]
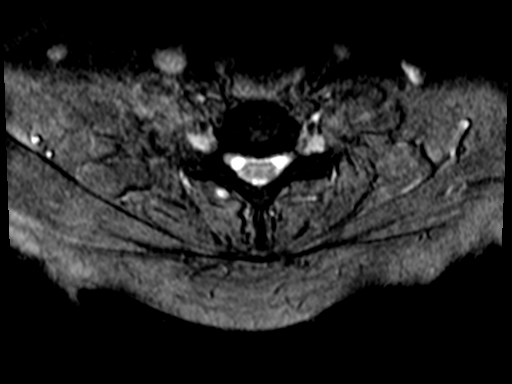
[im 10/23]
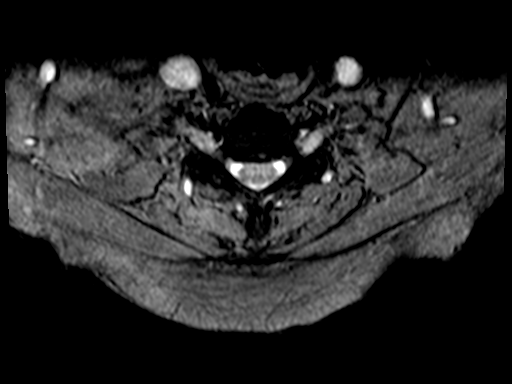
[im 13/23]
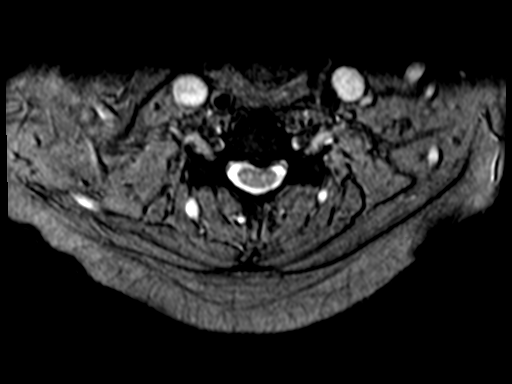
[im 16/23]
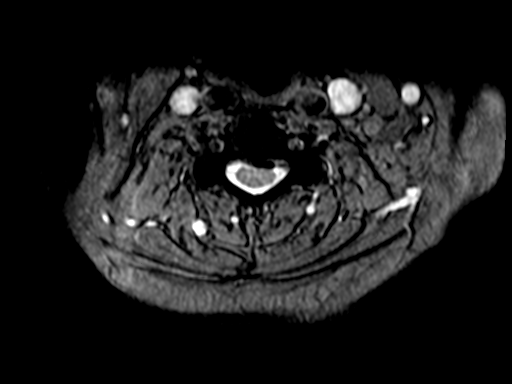

[Series 7: T1 · axial · non-contrast · 3.0mm · 0.78mm/px · z∈[-76,+6]mm · 8 of 23 slices shown (2 of 2)]
[im 1/23]
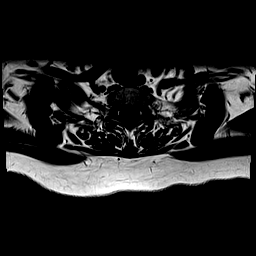
[im 4/23]
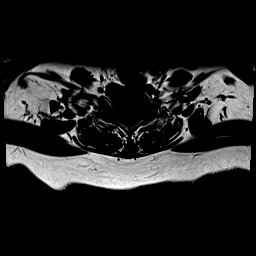
[im 7/23]
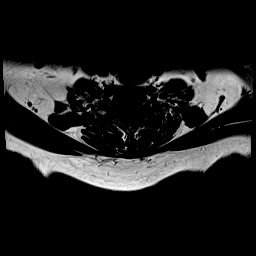
[im 10/23]
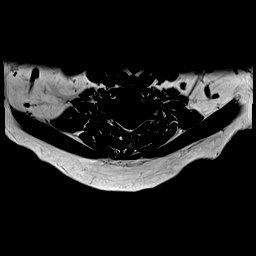
[im 13/23]
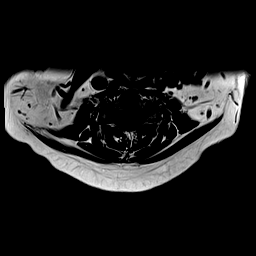
[im 16/23]
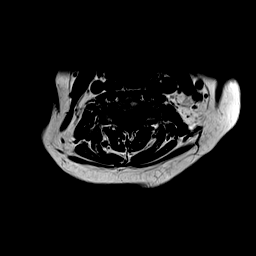
[im 19/23]
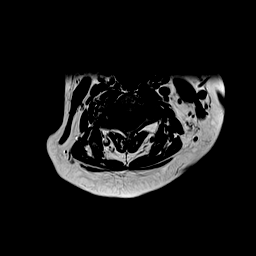
[im 23/23]
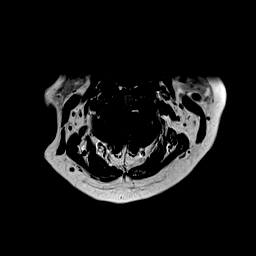

[Series 8: T1 fat-sat post-contrast · sagittal · 3.0mm · 0.69mm/px · 4 of 13 slices shown]
[im 1/13]
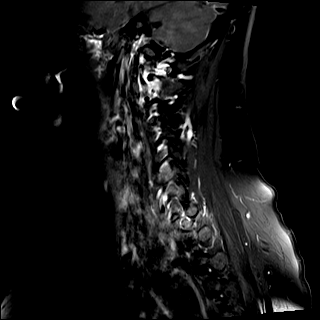
[im 5/13]
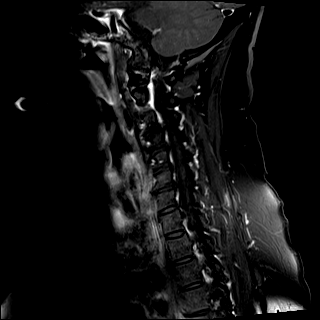
[im 9/13]
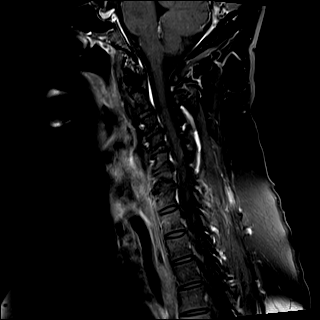
[im 13/13]
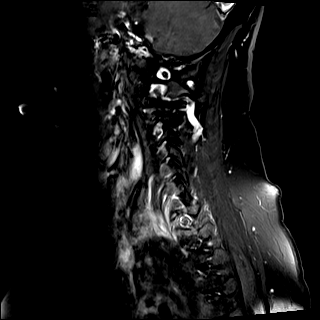

[Series 9: T1 post-contrast · axial · 3.0mm · 0.78mm/px · z∈[-76,+6]mm · 8 of 23 slices shown]
[im 1/23]
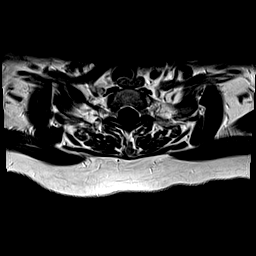
[im 4/23]
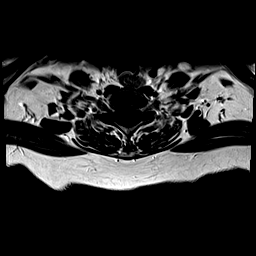
[im 7/23]
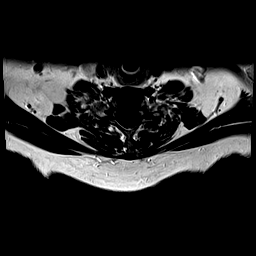
[im 10/23]
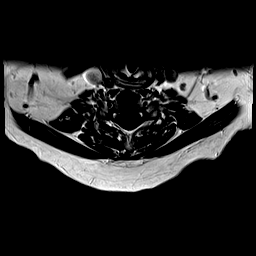
[im 13/23]
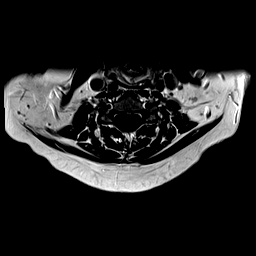
[im 16/23]
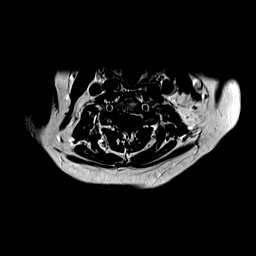
[im 19/23]
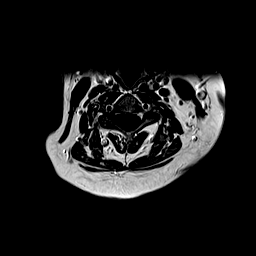
[im 23/23]
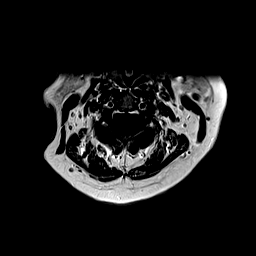

[46 of 48 positions shown; findings below may reference images not displayed]

FINDINGS: Alignment: Reversal of normal cervical lordosis. No static
subluxation.

Vertebrae: No fracture, evidence of discitis, or bone lesion.

Cord: Normal signal and morphology.

Posterior Fossa, vertebral arteries, paraspinal tissues: Negative.

Disc levels:

C1-2: Unremarkable.

C2-3: Moderate right facet hypertrophy. There is no spinal canal
stenosis. Moderate right neural foraminal stenosis.

C3-4: Small disc bulge. There is no spinal canal stenosis. No neural
foraminal stenosis.

C4-5: Intermediate sized left subarticular disc protrusion indenting
the ventral aspect of the spinal cord. Mild spinal canal stenosis.
No neural foraminal stenosis.

C5-6: Small disc bulge right-greater-than-left uncovertebral
hypertrophy. There is no spinal canal stenosis. Mild right and no
left neural foraminal stenosis.

C6-7: Small disc bulge with right-greater-than-left uncovertebral
spurring. There is no spinal canal stenosis. Mild right neural
foraminal stenosis.

C7-T1: Normal disc space and facet joints. There is no spinal canal
stenosis. No neural foraminal stenosis.
IMPRESSION: 1. Intermediate sized left subarticular disc protrusion at C4-5
indenting the ventral aspect of the spinal cord. Mild spinal canal
stenosis.
2. Mild right neural foraminal stenosis at C5-6 and C6-7.
3. Moderate right C2-3 neural foraminal stenosis.

## 2021-06-09 MED ORDER — GADOBUTROL 1 MMOL/ML IV SOLN
7.0000 mL | Freq: Once | INTRAVENOUS | Status: AC | PRN
  Administered 2021-06-09: 6 mL via INTRAVENOUS

## 2021-07-07 ENCOUNTER — Other Ambulatory Visit: Payer: Self-pay | Admitting: Podiatry

## 2021-07-07 ENCOUNTER — Other Ambulatory Visit (HOSPITAL_COMMUNITY): Payer: Self-pay | Admitting: Podiatry

## 2021-07-07 DIAGNOSIS — M7662 Achilles tendinitis, left leg: Secondary | ICD-10-CM

## 2021-07-07 DIAGNOSIS — M722 Plantar fascial fibromatosis: Secondary | ICD-10-CM

## 2021-07-07 DIAGNOSIS — M7672 Peroneal tendinitis, left leg: Secondary | ICD-10-CM

## 2021-07-14 ENCOUNTER — Other Ambulatory Visit: Payer: Self-pay

## 2021-07-14 ENCOUNTER — Ambulatory Visit
Admission: RE | Admit: 2021-07-14 | Discharge: 2021-07-14 | Disposition: A | Discharge status: Home / Self Care | Payer: BC Managed Care – PPO | Source: Ambulatory Visit | Attending: Podiatry | Admitting: Podiatry

## 2021-07-14 DIAGNOSIS — M7672 Peroneal tendinitis, left leg: Secondary | ICD-10-CM

## 2021-07-14 DIAGNOSIS — M7662 Achilles tendinitis, left leg: Secondary | ICD-10-CM | POA: Insufficient documentation

## 2021-07-14 DIAGNOSIS — M722 Plantar fascial fibromatosis: Secondary | ICD-10-CM | POA: Insufficient documentation

## 2021-07-14 IMAGING — MR MR ANKLE*L* W/O CM
6 series · 40 of 40 positions shown · non-contrast
Comparison: None.

CLINICAL DATA: Left ankle pain and weakness. History of muscular
dystrophy.

EXAM:
MRI OF THE LEFT ANKLE WITHOUT CONTRAST
TECHNIQUE: Multiplanar, multisequence MR imaging of the ankle was performed. No
intravenous contrast was administered.

[Series 3: PD fat-sat · axial · 3.0mm · 0.70mm/px · z∈[-98,+41]mm · 7 of 43 slices shown]
[im 1/43]
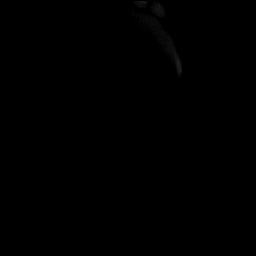
[im 8/43]
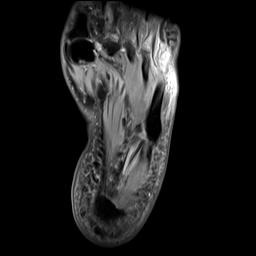
[im 15/43]
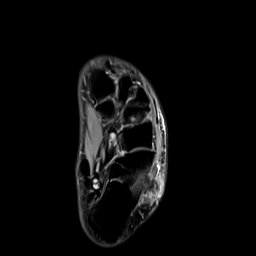
[im 22/43]
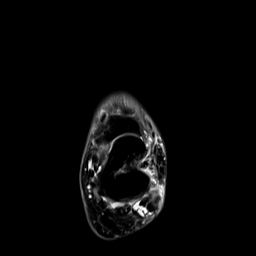
[im 29/43]
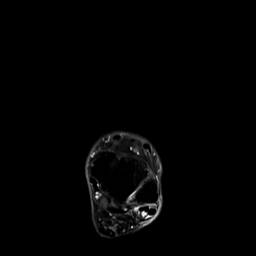
[im 36/43]
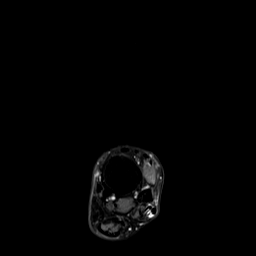
[im 43/43]
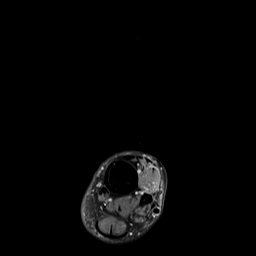

[Series 4: T2 fat-sat · axial · 3.0mm · 0.70mm/px · z∈[-98,+41]mm · 7 of 43 slices shown (1 of 2)]
[im 1/43]
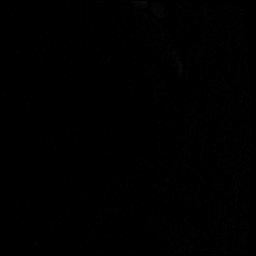
[im 8/43]
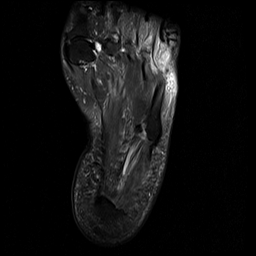
[im 15/43]
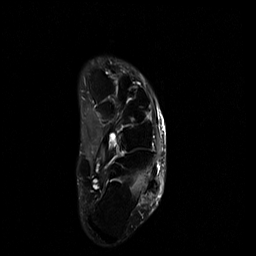
[im 22/43]
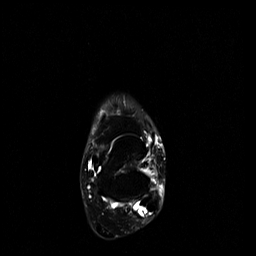
[im 29/43]
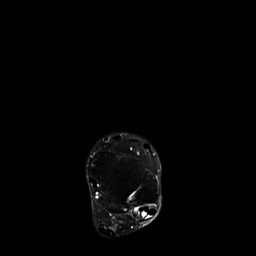
[im 36/43]
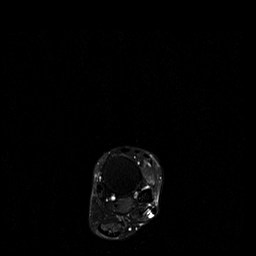
[im 43/43]
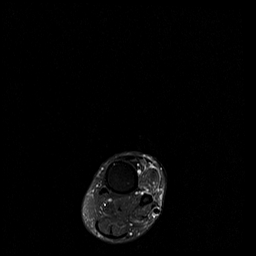

[Series 5: T2 fat-sat · coronal · 3.0mm · 0.70mm/px · 8 of 45 slices shown (2 of 2)]
[im 1/45]
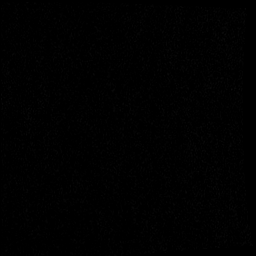
[im 7/45]
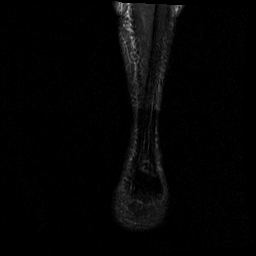
[im 13/45]
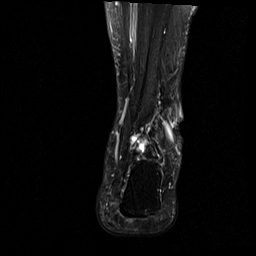
[im 19/45]
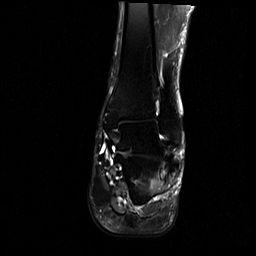
[im 26/45]
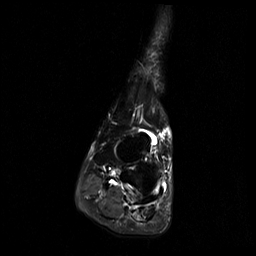
[im 32/45]
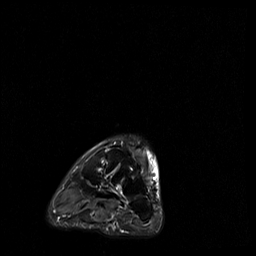
[im 38/45]
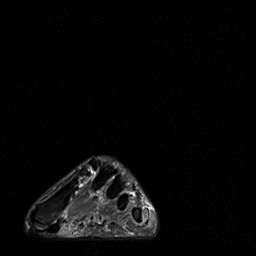
[im 45/45]
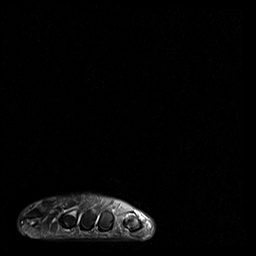

[Series 6: T1 · sagittal · 3.0mm · 0.56mm/px · 5 of 27 slices shown]
[im 1/27]
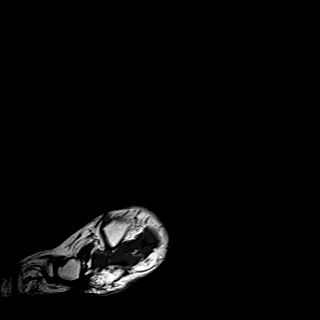
[im 7/27]
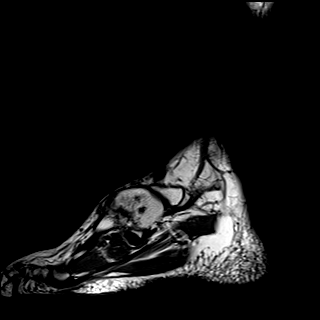
[im 14/27]
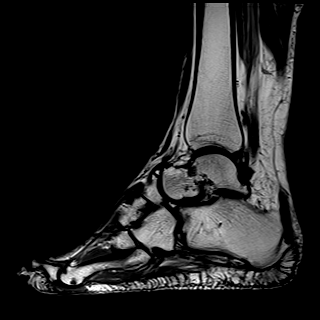
[im 20/27]
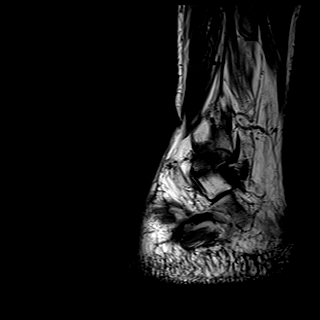
[im 27/27]
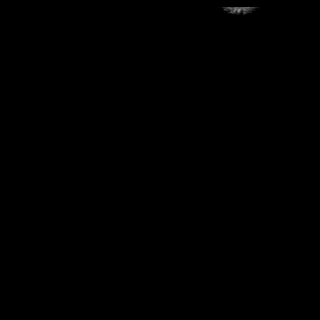

[Series 7: PD · axial · 3.0mm · 0.70mm/px · z∈[-98,+41]mm · 8 of 43 slices shown]
[im 1/43]
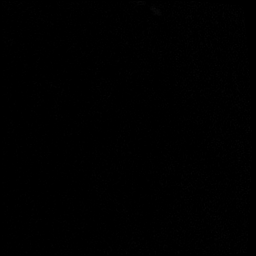
[im 7/43]
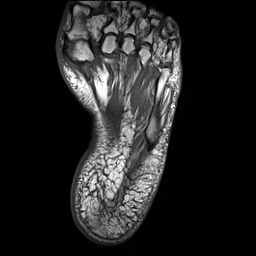
[im 13/43]
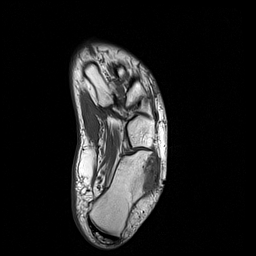
[im 19/43]
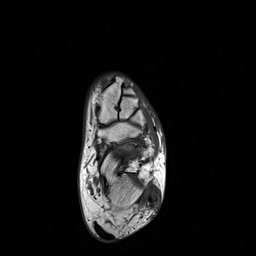
[im 25/43]
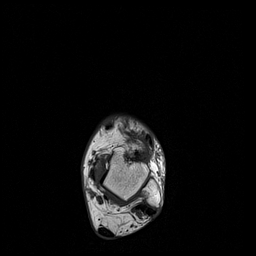
[im 31/43]
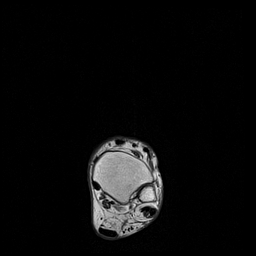
[im 37/43]
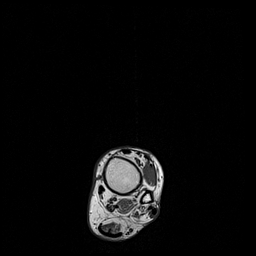
[im 43/43]
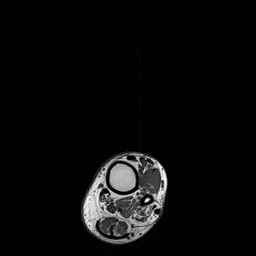

[Series 8: STIR · sagittal · 3.0mm · 0.70mm/px · 5 of 27 slices shown]
[im 1/27]
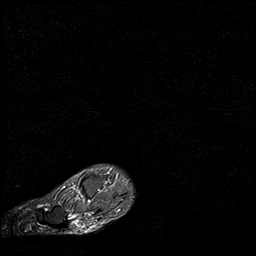
[im 7/27]
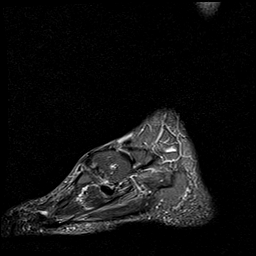
[im 14/27]
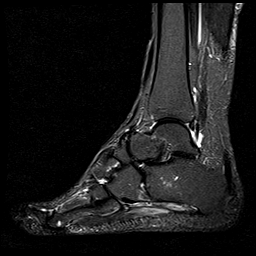
[im 20/27]
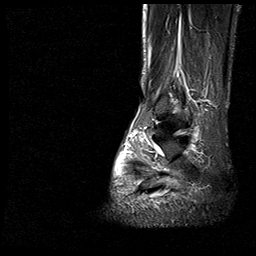
[im 27/27]
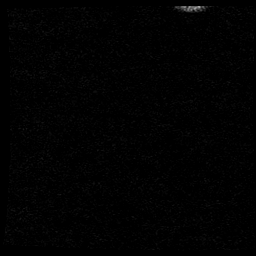

[40 of 40 positions shown; findings below may reference images not displayed]

FINDINGS: TENDONS

Peroneal: There is fluid in the sheaths of both tendons.
Longitudinal split tear of the peroneus brevis is seen from
approximately the level of the posterior facet of the subtalar joint
to near the calcaneocuboid joint. There is also a longitudinal split
tear of the peroneus longus just distal to the peroneal tubercle of
the calcaneus.

Posteromedial: Intact.

Anterior: Intact.

Achilles: Intact. There is mild intrasubstance increased T2 signal
in the distal tendon. No thickening. Spur at the tendon insertion on
the calcaneus noted.

Plantar Fascia: Intact.  No evidence of plantar fasciitis.

LIGAMENTS

Lateral: Intact.

Medial: Intact.

CARTILAGE

Ankle Joint: Normal.  No osteochondral lesion of the talar dome.

Subtalar Joints/Sinus Tarsi: Normal.

Bones: Mild marrow edema is seen tip of the lateral malleolus. More
intense edema is present in the peroneal tubercle of the calcaneus.
No fracture or worrisome lesion.

Other: None.
IMPRESSION: Peroneus longus and brevis tenosynovitis and longitudinal split
tearing as described above. Associated reactive marrow edema in the
distal fibula and peroneal tubercle of the calcaneus is much more
intense in the calcaneus.

Mild insertional Achilles tendinosis without tear.

## 2021-08-23 ENCOUNTER — Other Ambulatory Visit: Payer: Self-pay | Admitting: Podiatry

## 2021-08-24 ENCOUNTER — Other Ambulatory Visit: Payer: Self-pay | Admitting: Podiatry

## 2021-08-26 ENCOUNTER — Encounter
Admission: RE | Admit: 2021-08-26 | Discharge: 2021-08-26 | Disposition: A | Discharge status: Home / Self Care | Payer: BC Managed Care – PPO | Source: Ambulatory Visit | Attending: Podiatry | Admitting: Podiatry

## 2021-08-26 ENCOUNTER — Encounter: Payer: Self-pay | Admitting: Podiatry

## 2021-08-26 ENCOUNTER — Other Ambulatory Visit: Payer: Self-pay

## 2021-08-26 HISTORY — DX: Gastro-esophageal reflux disease without esophagitis: K21.9

## 2021-08-26 HISTORY — DX: Pneumonia, unspecified organism: J18.9

## 2021-08-26 HISTORY — DX: Anxiety disorder, unspecified: F41.9

## 2021-08-26 HISTORY — DX: Essential (primary) hypertension: I10

## 2021-08-26 HISTORY — DX: Hereditary motor and sensory neuropathy: G60.0

## 2021-08-26 HISTORY — DX: Deficiency of other specified B group vitamins: E53.8

## 2021-08-26 HISTORY — DX: Cardiac murmur, unspecified: R01.1

## 2021-08-26 HISTORY — DX: Overactive bladder: N32.81

## 2021-08-26 HISTORY — DX: Nausea with vomiting, unspecified: Z98.890

## 2021-08-26 HISTORY — DX: Depression, unspecified: F32.A

## 2021-08-26 HISTORY — DX: Polyneuropathy, unspecified: G62.9

## 2021-08-26 NOTE — Patient Instructions (Addendum)
Your procedure is scheduled on: Friday, September 9 Report to the Registration Desk on the 1st floor of the [facility] To find out your arrival time, please call [phone] between 1PM - 3PM on: Thursday, September 8  REMEMBER: Instructions that are not followed completely may result in serious medical risk, up to and including death; or upon the discretion of your surgeon and anesthesiologist your surgery may need to be rescheduled.  Do not eat food after midnight the night before surgery.  No gum chewing, lozengers or hard candies.  You may however, drink CLEAR liquids up to 2 hours before you are scheduled to arrive for your surgery. Do not drink anything within 2 hours of your scheduled arrival time.  Clear liquids include: - water  - apple juice without pulp - gatorade (not RED, PURPLE, OR BLUE) - black coffee or tea (Do NOT add milk or creamers to the coffee or tea) Do NOT drink anything that is not on this list.  In addition, your doctor has ordered for you to drink the provided  Ensure Pre-Surgery Clear Carbohydrate Drink  Drinking this carbohydrate drink up to two hours before surgery helps to reduce insulin resistance and improve patient outcomes. Please complete drinking 2 hours prior to scheduled arrival time.  TAKE THESE MEDICATIONS THE MORNING OF SURGERY WITH A SIP OF WATER:  Buspirone (Buspar) Duloxetine (Cymbalta) Famotidine (Pepcid) - (take one the night before and one on the morning of surgery - helps to prevent nausea after surgery.) Oxybutynin  Oxycodone if needed for pain Pantoprazole Pregabalin (Lyrica)  One week prior to surgery: starting September 2 Stop Anti-inflammatories (NSAIDS) such as Advil, Aleve, Ibuprofen, Motrin, Naproxen, Naprosyn and Aspirin based products such as Excedrin, Goodys Powder, BC Powder. Stop ANY OVER THE COUNTER supplements until after surgery. You may however, continue to take Tylenol if needed for pain up until the day of  surgery.  No Alcohol for 24 hours before or after surgery.  No Smoking including e-cigarettes for 24 hours prior to surgery.  No chewable tobacco products for at least 6 hours prior to surgery.  No nicotine patches on the day of surgery.  Do not use any "recreational" drugs for at least a week prior to your surgery.  Please be advised that the combination of cocaine and anesthesia may have negative outcomes, up to and including death. If you test positive for cocaine, your surgery will be cancelled.  On the morning of surgery brush your teeth with toothpaste and water, you may rinse your mouth with mouthwash if you wish. Do not swallow any toothpaste or mouthwash.  Do not wear jewelry, make-up, hairpins, clips or nail polish.  Do not wear lotions, powders, or perfumes.   Do not shave body from the neck down 48 hours prior to surgery just in case you cut yourself which could leave a site for infection.  Also, freshly shaved skin may become irritated if using the CHG soap.  Contact lenses, hearing aids and dentures may not be worn into surgery.  Do not bring valuables to the hospital. [facility] is not responsible for any missing/lost belongings or valuables.   Use CHG Soap as directed on instruction sheet. If unable to come and pick up this soap, shower using antibacterial soap prior to coming to the hospital on the day of surgery.  Notify your doctor if there is any change in your medical condition (cold, fever, infection).  Wear comfortable clothing (specific to your surgery type) to the  hospital.  After surgery, you can help prevent lung complications by doing breathing exercises.  Take deep breaths and cough every 1-2 hours. Your doctor may order a device called an Incentive Spirometer to help you take deep breaths.  If you are being discharged the day of surgery, you will not be allowed to drive home. You will need a responsible adult (18 years or older) to drive you home and  stay with you that night.   If you are taking public transportation, you will need to have a responsible adult (18 years or older) with you. Please confirm with your physician that it is acceptable to use public transportation.   Please call the Pre-admissions Testing Dept. at [phone] if you have any questions about these instructions.  Surgery Visitation Policy:  Patients undergoing a surgery or procedure may have one family member or support person with them as long as that person is not COVID-19 positive or experiencing its symptoms.  That person may remain in the waiting area during the procedure.

## 2021-08-26 NOTE — Progress Notes (Addendum)
Perioperative Services  Pre-Admission/Anesthesia Testing Clinical Review  Date: 08/26/21  Patient Demographics:  Name: Tiffany Barajas DOB:   1961/10/17 MRN:   [id number]  Planned Surgical Procedure(s):    Case: [id number] Date/Time: 09/03/21 0715   Procedures:      RECONSTRUCTION ANKLE- BROSTRUM-GOULD (Left: Ankle)     GASTROC RECESSION (Left)     FLAT FOOT CORRECTION- EVANS/MCDO (Left)     HALLUX VALGUS BASE WEDGE (Left)     SADDLEBONE (Left)     ENDOSCOPIC PLANTAR FASC RELEASE (Left)     FLEXOR TENDON REPAIR-SECOND (Left)   Anesthesia type: Choice   Pre-op diagnosis:      - Charcot Marie Tooth muscular atrophy      Peroneal tendinitis of left lower extremity      Gastrocnemius equinus, left Exostosis of left foot   Location: [facility] OR ROOM 01 / [facility]   Surgeons: Tiffany Barajas, DPM     NOTE: Available PAT nursing documentation and vital signs have been reviewed. Clinical nursing staff has updated patient's PMH/PSHx, current medication list, and drug allergies/intolerances to ensure comprehensive history available to assist in medical decision making as it pertains to the aforementioned surgical procedure and anticipated anesthetic course. Extensive review of available clinical information performed. Tiffany Barajas PMH and PSHx updated with any diagnoses/procedures that  may have been inadvertently omitted during her intake with the pre-admission testing department's nursing staff.  Clinical Discussion:  Tiffany Barajas is a 60 y.o. female who is submitted for pre-surgical anesthesia review and clearance prior to her undergoing the above procedure. Patient is a Former Smoker (quit 12/1998). Pertinent PMH includes: cardiomyopathy, equivalent angina, cardiac murmur, HTN, HLD, hypoglycemic disorder, GERD (on daily PPI), Barrett's esophagus, anemia, Charcot-Marie-Tooth disease, depression, anxiety.  Patient is followed by cardiology Tiffany Bigness, MD). She was last  seen in the cardiology clinic on 04/01/2021; notes reviewed.  At the time of her clinic visit, patient denied any episodes of chest pain, however complained of significant epigastric discomfort that she attributed to her underlying GERD diagnosis.  Pain described as a "twisting" sensation following consumption of certain foods, beverages, medications.  Additionally, patient having episodes of BILATERAL neck pain, which is new for her.  She denies any episodes of significant shortness of breath, PND, orthopnea, palpitations, significant peripheral edema, vertiginous symptoms, or presyncope/syncope.  PMH significant for cardiovascular diagnoses.  TTE performed on 03/16/2021 revealed grossly normal systolic function with an EF of >55%.  There was trivial to mild AR, MR, and TR; no PR.  No evidence of valvular stenosis.  Myocardial perfusion imaging study performed on 03/16/2021 revealing moderate to severe left ventricular systolic dysfunction with an EF of 25-30%.  There was inferior apical wall hypokinesis noted.  Left ventricle was enlarged.  Study demonstrated evidence of a persistent inferior apical defect consistent with scar and significant RV uptake consistent with a cardiomyopathy.  There was no evidence of reversible ischemia.  Diagnostic left heart catheterization was recommended, however patient wished to defer.  She was referred to [facility] for further evaluation and possible intervention.  Patient scheduled follow-up following her consultation with [facility] cardiology. Patient seen in consult on 04/21/2021 by Tiffany Masson, NP-C; notes reviewed.  Noninvasive diagnostic cardiovascular testing reviewed.  At the time of her clinic visit, patient reported the same epigastric pain that she reported to Dr. Etta Barajas office.  She denied other significant anginal equivalent symptoms.  Patient with a strong family history of cardiovascular disease; father suffered MI x 3, with  the first being at age 65.  Discussed  recommendations for CTA versus left heart catheterization, and the patient elected to pursue the least invasive diagnostic approach via the CTA.  Of note, [facility] with contrast shortage, therefore myocardial PET CT was ordered. Myocardial perfusion PET/CT performed on 05/11/2021 demonstrated a normal left ventricular systolic function with an EF of >60%.  There were minimal coronary calcifications noted.  There was no evidence of significant ischemia or scar.  Study determined to be normal and low risk. Blood pressure within normal range at 122/76 on currently prescribed diuretic and beta-blocker therapies.  Patient not currently taking any type of lipid-lowering therapies for ASCVD prevention.  She is not diabetic. Functional capacity, as defined by DASI, is documented as being >/= 4 METS. No changes were made to her medication regimen. Patient to follow-up with outpatient cardiology in 6 weeks or sooner if needed.  Patient is scheduled to undergo elective podiatric procedures on her LEFT foot on 09/03/2021 with Dr. Samara Barajas, DPM.  Given patient's past medical history significant for cardiovascular disease, presurgical cardiac clearance was sought by the performing surgeon's office and PAT team.  Per cardiology, "patient has undergone PET stress test for ischemic evaluation that was negative for ischemia or scar.  It also showed normal left ventricular function.  She may proceed with planned ankle surgery without further cardiac evaluation.  Consider her to be LOW to INTERMEDIATE surgical risk in the setting of an intermediate risk surgery".  Patient reports previous perioperative complications with anesthesia in the past. Patient has a PMH (+) for PONV. Order placed by PAT for patient to receive a prophylactic preoperative dose of aprepitant. In review of the available records, it is noted that patient underwent a general anesthetic course at [facility] in 01/2021 without documented complications.    Vitals with BMI 08/26/2021 06/09/2021 08/29/2019  Height '4\' 10"'  - '4\' 11"'   Weight 163 lbs 150 lbs 150 lbs  BMI 57.26 - 20.35  Systolic - - 597  Diastolic - - 78  Pulse - - 80    Providers/Specialists:   NOTE: Primary physician provider listed below. Patient may have been seen by APP or partner within same practice.   PROVIDER ROLE / SPECIALTY LAST Montel Culver, DPM Podiatry (Surgeon) 08/16/2021  Maeola Sarah, MD Primary Care Provider 05/26/2021  Katrine Coho, MD [facility] Cardiology 04/01/2021  Tiffany Masson, NP-C [facility] Cardiology 04/21/2021   Allergies:  Lisinopril and Morphine  Current Home Medications:   No current facility-administered medications for this encounter.    acetaminophen (TYLENOL) 500 MG tablet   busPIRone (BUSPAR) 5 MG tablet   cyanocobalamin (,VITAMIN B-12,) 1000 MCG/ML injection   DULoxetine (CYMBALTA) 60 MG capsule   EPINEPHrine 0.3 mg/0.3 mL IJ SOAJ injection   famotidine (PEPCID) 20 MG tablet   hydrochlorothiazide (HYDRODIURIL) 25 MG tablet   ibuprofen (ADVIL) 800 MG tablet   metoprolol succinate (TOPROL-XL) 25 MG 24 hr tablet   oxybutynin (DITROPAN-XL) 10 MG 24 hr tablet   oxyCODONE (OXYCONTIN) 10 mg 12 hr tablet   pantoprazole (PROTONIX) 40 MG tablet   pregabalin (LYRICA) 50 MG capsule   traZODone (DESYREL) 100 MG tablet   History:   Past Medical History:  Diagnosis Date   Anemia    Anxiety    B12 deficiency    Barrett's esophagus    Cardiomyopathy (HCC)    Charcot-Marie-Tooth disease    Depression    Equivalent angina (HCC)    GERD (gastroesophageal reflux  disease)    Heart murmur    HLD (hyperlipidemia)    Hypertension    Hypoglycemic disorder    Overactive bladder    Peripheral neuropathy    Pneumonia    PONV (postoperative nausea and vomiting)    Shortness of breath    Past Surgical History:  Procedure Laterality Date   ABDOMINAL HYSTERECTOMY  1996   CARPAL TUNNEL RELEASE Left 1995   CARPAL TUNNEL RELEASE  Right 1996   [place]   COLONOSCOPY     several   ESOPHAGOGASTRODUODENOSCOPY N/A 01/29/2021   Location: [facility]; Surgeon: Weber Cooks, MD   EVALUATION UNDER ANESTHESIA WITH HEMORRHOIDECTOMY AND PROCTOSCOPY N/A 12/12/2004   Procedure: Protoscopy; drainage of thrombosed hemmorhoid; hemorrhoidectomy; Location: [facility]; Surgeon: Ardeen Jourdain, MD   EVALUATION UNDER ANESTHESIA WITH HEMORRHOIDECTOMY AND PROCTOSCOPY N/A 05/03/2010   Procedure: Anoscopy; anal skin tag excision; external hemorrhoidectomy x 2; Location: [facility]; Surgeon: Kem Parkinson, MD   KNEE ARTHROSCOPY Left 1987   KNEE LIGAMENT RECONSTRUCTION Right 1981   pin placed   LAPAROSCOPIC GASTRIC BYPASS N/A 11/12/2004   Procedure: RNY bypass; Location: [facility]; Surgeon: Lise Auer, MD   TUBAL LIGATION  1985   Family History  Problem Relation Age of Onset   Breast cancer Cousin 86       pat cousin   Atrial fibrillation Mother    Diabetes Mother    Hypertension Mother    Cancer Father    Heart disease Father    Stroke Father    Social History   Tobacco Use   Smoking status: Former    Types: Cigarettes    Quit date: 2000    Years since quitting: 22.6   Smokeless tobacco: Never  Vaping Use   Vaping Use: Never used  Substance Use Topics   Alcohol use: Yes    Comment: wine daily   Drug use: Never    Pertinent Clinical Results:  LABS: Labs reviewed: Acceptable for surgery.   Ref Range & Units 07/21/2021  Sodium 135 - 145 mmol/L 137   Potassium 3.5 - 5.1 mmol/L 4.1   Chloride 98 - 107 mmol/L 103   CO2 20.0 - 31.0 mmol/L 29.2   Anion Gap 5 - 14 mmol/L 5   BUN 9 - 23 mg/dL 15   Creatinine 0.60 - 0.80 mg/dL 0.57 Low    BUN/Creatinine Ratio  26   EGFR CKD-EPI Non-African American, Female >=60 mL/min/1.12m >90   EGFR CKD-EPI African American, Female >=60 mL/min/1.737m>90   Glucose 70 - 179 mg/dL 101   Calcium 8.7 - 10.4 mg/dL 10.4   [facility]  Specimen Collected: 04/21/21 15:23 Last Resulted: 04/21/21 16:07  [facility]: 05/31/21 15:49     ECG: Date: 04/21/2021 Rate: 58 bpm Rhythm:  Sinus bradycardia with sinus arrhythmia Axis (leads I and aVF): Normal Intervals: PR 122 ms. QRS 80 ms. QTc 410 ms. ST segment and T wave changes: No evidence of acute ST segment elevation or depression Comparison: No previous tracings available for review and comparison.  NOTE[facility]. Above based on cardiologist's interpretation.    IMAGING / PROCEDURES:  MYOCARDIAL PERFUSION PET CT performed on 05/11/2021 Left ventricular systolic function normal Post stress LVEF is greater than 60% Minimal coronary calcifications noted No evidence of significant ischemia or scar Normal myocardial perfusion imaging study  LEXISCAN performed on 03/16/2021 Moderately  to severely depressed LVEF of 25-30% Significant inferior apical hypokinesis Persistent inferior apical defect consistent with scar Significant RV uptake consistent with a cardiomyopathy No clear evidence of reversible ischemia Recommendations: Cardiomyopathy and HF treatment evaluation and consideration of invasive evaluation if symptoms persist or worsen.  TRANSTHORACIC ECHOCARDIOGRAM performed on 03/16/2021 LVEF >55%  Normal left ventricular systolic function  Normal right ventricular systolic function Mild AR and TR Trivial MR No PR No valvular stenosis No evidence of pericardial effusion  Impression and Plan:  Tiffany Barajas has been referred for pre-anesthesia review and clearance prior to her undergoing the planned anesthetic and procedural courses. Available labs, pertinent testing, and imaging results were personally reviewed by me. This patient has been appropriately cleared by cardiology with an overall LOW risk of significant perioperative cardiovascular complications.  Based on clinical  review performed today (08/26/21), barring any significant acute changes in the patient's overall condition, it is anticipated that she will be able to proceed with the planned surgical intervention. Any acute changes in clinical condition may necessitate her procedure being postponed and/or cancelled. Patient will meet with anesthesia team (MD and/or CRNA) on the day of her procedure for preoperative evaluation/assessment. Questions regarding anesthetic course will be fielded at that time.   Pre-surgical instructions were reviewed with the patient during her PAT appointment and questions were fielded by PAT clinical staff. Patient was advised that if any questions or concerns arise prior to her procedure then she should return a call to PAT and/or her surgeon's office to discuss.  Honor Loh, MSN, APRN, FNP-C, CEN [facility]  Peri-operative Services Nurse Practitioner Phone: [phone] Fax: [phone] 08/26/21 5:03 PM  NOTE: This note has been prepared using Dragon dictation software. Despite my best ability to proofread, there is always the potential that unintentional transcriptional errors may still occur from this process.

## 2021-09-03 ENCOUNTER — Encounter: Payer: Self-pay | Admitting: Podiatry

## 2021-09-03 ENCOUNTER — Ambulatory Visit
Admission: RE | Admit: 2021-09-03 | Discharge: 2021-09-03 | Disposition: A | Discharge status: Home / Self Care | Payer: BC Managed Care – PPO | Source: Ambulatory Visit | Attending: Podiatry | Admitting: Podiatry

## 2021-09-03 ENCOUNTER — Ambulatory Visit: Payer: BC Managed Care – PPO | Admitting: Urgent Care

## 2021-09-03 ENCOUNTER — Encounter
Admission: RE | Disposition: A | Discharge status: Home / Self Care | Payer: Self-pay | Source: Ambulatory Visit | Attending: Podiatry

## 2021-09-03 ENCOUNTER — Other Ambulatory Visit: Payer: Self-pay

## 2021-09-03 ENCOUNTER — Ambulatory Visit: Payer: BC Managed Care – PPO

## 2021-09-03 DIAGNOSIS — M7672 Peroneal tendinitis, left leg: Secondary | ICD-10-CM | POA: Insufficient documentation

## 2021-09-03 DIAGNOSIS — M899 Disorder of bone, unspecified: Secondary | ICD-10-CM | POA: Diagnosis not present

## 2021-09-03 DIAGNOSIS — Z87891 Personal history of nicotine dependence: Secondary | ICD-10-CM | POA: Insufficient documentation

## 2021-09-03 DIAGNOSIS — K219 Gastro-esophageal reflux disease without esophagitis: Secondary | ICD-10-CM | POA: Diagnosis not present

## 2021-09-03 DIAGNOSIS — Z888 Allergy status to other drugs, medicaments and biological substances status: Secondary | ICD-10-CM | POA: Diagnosis not present

## 2021-09-03 DIAGNOSIS — Z96659 Presence of unspecified artificial knee joint: Secondary | ICD-10-CM | POA: Diagnosis not present

## 2021-09-03 DIAGNOSIS — Z79899 Other long term (current) drug therapy: Secondary | ICD-10-CM | POA: Diagnosis not present

## 2021-09-03 DIAGNOSIS — Z9884 Bariatric surgery status: Secondary | ICD-10-CM | POA: Diagnosis not present

## 2021-09-03 DIAGNOSIS — Z885 Allergy status to narcotic agent status: Secondary | ICD-10-CM | POA: Diagnosis not present

## 2021-09-03 DIAGNOSIS — I429 Cardiomyopathy, unspecified: Secondary | ICD-10-CM | POA: Diagnosis not present

## 2021-09-03 DIAGNOSIS — G600 Hereditary motor and sensory neuropathy: Secondary | ICD-10-CM | POA: Diagnosis present

## 2021-09-03 DIAGNOSIS — I10 Essential (primary) hypertension: Secondary | ICD-10-CM | POA: Diagnosis not present

## 2021-09-03 DIAGNOSIS — E785 Hyperlipidemia, unspecified: Secondary | ICD-10-CM | POA: Diagnosis not present

## 2021-09-03 DIAGNOSIS — Z79890 Hormone replacement therapy (postmenopausal): Secondary | ICD-10-CM | POA: Insufficient documentation

## 2021-09-03 DIAGNOSIS — M216X2 Other acquired deformities of left foot: Secondary | ICD-10-CM | POA: Insufficient documentation

## 2021-09-03 HISTORY — PX: HALLUX VALGUS BASE WEDGE: SHX6624

## 2021-09-03 HISTORY — PX: ANKLE RECONSTRUCTION: SHX1151

## 2021-09-03 HISTORY — DX: Cardiomyopathy, unspecified: I42.9

## 2021-09-03 HISTORY — PX: PLANTAR FASCIA RELEASE: SHX2239

## 2021-09-03 HISTORY — DX: Hyperlipidemia, unspecified: E78.5

## 2021-09-03 HISTORY — DX: Hypoglycemia, unspecified: E16.2

## 2021-09-03 HISTORY — PX: FLAT FOOT RECONSTRUCTION-TAL GASTROC RECESSION: SHX6620

## 2021-09-03 HISTORY — PX: FLAT FOOT CORRECTION: SHX6619

## 2021-09-03 HISTORY — PX: TENDON REPAIR: SHX5111

## 2021-09-03 HISTORY — DX: Shortness of breath: R06.02

## 2021-09-03 HISTORY — DX: Other forms of angina pectoris: I20.89

## 2021-09-03 HISTORY — DX: Other forms of angina pectoris: I20.8

## 2021-09-03 HISTORY — DX: Barrett's esophagus without dysplasia: K22.70

## 2021-09-03 HISTORY — DX: Anemia, unspecified: D64.9

## 2021-09-03 HISTORY — PX: BONE EXOSTOSIS EXCISION: SHX1249

## 2021-09-03 SURGERY — RECONSTRUCTION, ANKLE
Anesthesia: General | Site: Ankle | Laterality: Left

## 2021-09-03 MED ORDER — LIDOCAINE HCL (CARDIAC) PF 100 MG/5ML IV SOSY
PREFILLED_SYRINGE | INTRAVENOUS | Status: DC | PRN
  Administered 2021-09-03: 80 mg via INTRAVENOUS

## 2021-09-03 MED ORDER — BUPIVACAINE-EPINEPHRINE 0.25% -1:200000 IJ SOLN
INTRAMUSCULAR | Status: DC | PRN
  Administered 2021-09-03: 30 mL
  Administered 2021-09-03: 20 mL

## 2021-09-03 MED ORDER — MEPERIDINE HCL 25 MG/ML IJ SOLN: 6.2500 mg | INTRAMUSCULAR | Status: DC | PRN

## 2021-09-03 MED ORDER — PROPOFOL 10 MG/ML IV BOLUS
INTRAVENOUS | Status: DC | PRN
  Administered 2021-09-03 (×2): 50 mg via INTRAVENOUS
  Administered 2021-09-03: 110 mg via INTRAVENOUS

## 2021-09-03 MED ORDER — DEXMEDETOMIDINE (PRECEDEX) IN NS 20 MCG/5ML (4 MCG/ML) IV SYRINGE
PREFILLED_SYRINGE | INTRAVENOUS | Status: AC
  Filled 2021-09-03: qty 5

## 2021-09-03 MED ORDER — SUGAMMADEX SODIUM 200 MG/2ML IV SOLN
INTRAVENOUS | Status: DC | PRN
  Administered 2021-09-03: 200 mg via INTRAVENOUS

## 2021-09-03 MED ORDER — MIDAZOLAM HCL 2 MG/2ML IJ SOLN
INTRAMUSCULAR | Status: DC | PRN
  Administered 2021-09-03: 2 mg via INTRAVENOUS

## 2021-09-03 MED ORDER — ONDANSETRON HCL 4 MG PO TABS: 4.0000 mg | ORAL_TABLET | Freq: Four times a day (QID) | ORAL | Status: DC | PRN

## 2021-09-03 MED ORDER — KETAMINE HCL 10 MG/ML IJ SOLN
INTRAMUSCULAR | Status: DC | PRN
  Administered 2021-09-03: 30 mg via INTRAVENOUS
  Administered 2021-09-03: 20 mg via INTRAVENOUS

## 2021-09-03 MED ORDER — METOCLOPRAMIDE HCL 5 MG/ML IJ SOLN: 5.0000 mg | Freq: Three times a day (TID) | INTRAMUSCULAR | Status: DC | PRN

## 2021-09-03 MED ORDER — 0.9 % SODIUM CHLORIDE (POUR BTL) OPTIME
TOPICAL | Status: DC | PRN
  Administered 2021-09-03: 500 mL

## 2021-09-03 MED ORDER — CEFAZOLIN SODIUM-DEXTROSE 2-4 GM/100ML-% IV SOLN
INTRAVENOUS | Status: AC
  Filled 2021-09-03: qty 100

## 2021-09-03 MED ORDER — FENTANYL CITRATE (PF) 100 MCG/2ML IJ SOLN: 25.0000 ug | INTRAMUSCULAR | Status: DC | PRN

## 2021-09-03 MED ORDER — POVIDONE-IODINE 7.5 % EX SOLN
Freq: Once | CUTANEOUS | Status: DC
  Filled 2021-09-03: qty 118

## 2021-09-03 MED ORDER — APREPITANT 40 MG PO CAPS: 40.0000 mg | ORAL_CAPSULE | Freq: Once | ORAL | Status: AC

## 2021-09-03 MED ORDER — LIDOCAINE-EPINEPHRINE 1 %-1:100000 IJ SOLN
INTRAMUSCULAR | Status: AC
  Filled 2021-09-03: qty 1

## 2021-09-03 MED ORDER — ONDANSETRON HCL 4 MG/2ML IJ SOLN: 4.0000 mg | Freq: Four times a day (QID) | INTRAMUSCULAR | Status: DC | PRN

## 2021-09-03 MED ORDER — BUPIVACAINE LIPOSOME 1.3 % IJ SUSP
INTRAMUSCULAR | Status: AC
  Filled 2021-09-03: qty 20

## 2021-09-03 MED ORDER — OXYCODONE-ACETAMINOPHEN 5-325 MG PO TABS: 1.0000 | ORAL_TABLET | Freq: Four times a day (QID) | ORAL | 0 refills | Status: AC | PRN

## 2021-09-03 MED ORDER — APREPITANT 40 MG PO CAPS
ORAL_CAPSULE | ORAL | Status: AC
  Administered 2021-09-03: 40 mg via ORAL
  Filled 2021-09-03: qty 1

## 2021-09-03 MED ORDER — PROPOFOL 1000 MG/100ML IV EMUL
INTRAVENOUS | Status: AC
  Filled 2021-09-03: qty 100

## 2021-09-03 MED ORDER — SODIUM CHLORIDE (PF) 0.9 % IJ SOLN
INTRAMUSCULAR | Status: DC | PRN
  Administered 2021-09-03: 10 mL via INTRAVENOUS

## 2021-09-03 MED ORDER — OXYCODONE HCL 5 MG/5ML PO SOLN: 5.0000 mg | Freq: Once | ORAL | Status: DC | PRN

## 2021-09-03 MED ORDER — METOCLOPRAMIDE HCL 10 MG PO TABS: 5.0000 mg | ORAL_TABLET | Freq: Three times a day (TID) | ORAL | Status: DC | PRN

## 2021-09-03 MED ORDER — FENTANYL CITRATE (PF) 100 MCG/2ML IJ SOLN
INTRAMUSCULAR | Status: DC | PRN
  Administered 2021-09-03 (×2): 50 ug via INTRAVENOUS

## 2021-09-03 MED ORDER — BUPIVACAINE HCL (PF) 0.5 % IJ SOLN
INTRAMUSCULAR | Status: AC
  Filled 2021-09-03: qty 30

## 2021-09-03 MED ORDER — CHLORHEXIDINE GLUCONATE 0.12 % MT SOLN: 15.0000 mL | Freq: Once | OROMUCOSAL | Status: AC

## 2021-09-03 MED ORDER — MIDAZOLAM HCL 2 MG/2ML IJ SOLN
INTRAMUSCULAR | Status: AC
  Filled 2021-09-03: qty 2

## 2021-09-03 MED ORDER — DEXAMETHASONE SODIUM PHOSPHATE 10 MG/ML IJ SOLN
INTRAMUSCULAR | Status: DC | PRN
  Administered 2021-09-03: 10 mg via INTRAVENOUS

## 2021-09-03 MED ORDER — LIDOCAINE HCL (PF) 1 % IJ SOLN
INTRAMUSCULAR | Status: AC
  Filled 2021-09-03: qty 30

## 2021-09-03 MED ORDER — CEFAZOLIN SODIUM-DEXTROSE 2-4 GM/100ML-% IV SOLN
2.0000 g | INTRAVENOUS | Status: AC
  Administered 2021-09-03: 2 g via INTRAVENOUS

## 2021-09-03 MED ORDER — ORAL CARE MOUTH RINSE: 15.0000 mL | Freq: Once | OROMUCOSAL | Status: AC

## 2021-09-03 MED ORDER — ACETAMINOPHEN 10 MG/ML IV SOLN
INTRAVENOUS | Status: DC | PRN
  Administered 2021-09-03: 1000 mg via INTRAVENOUS

## 2021-09-03 MED ORDER — ACETAMINOPHEN 10 MG/ML IV SOLN
INTRAVENOUS | Status: AC
  Filled 2021-09-03: qty 100

## 2021-09-03 MED ORDER — SUGAMMADEX SODIUM 500 MG/5ML IV SOLN
INTRAVENOUS | Status: AC
  Filled 2021-09-03: qty 5

## 2021-09-03 MED ORDER — ONDANSETRON HCL 4 MG/2ML IJ SOLN
INTRAMUSCULAR | Status: AC
  Filled 2021-09-03: qty 2

## 2021-09-03 MED ORDER — FENTANYL CITRATE (PF) 100 MCG/2ML IJ SOLN
INTRAMUSCULAR | Status: AC
  Filled 2021-09-03: qty 2

## 2021-09-03 MED ORDER — BUPIVACAINE-EPINEPHRINE (PF) 0.25% -1:200000 IJ SOLN
INTRAMUSCULAR | Status: AC
  Filled 2021-09-03: qty 30

## 2021-09-03 MED ORDER — DEXAMETHASONE SODIUM PHOSPHATE 10 MG/ML IJ SOLN
INTRAMUSCULAR | Status: AC
  Filled 2021-09-03: qty 1

## 2021-09-03 MED ORDER — CHLORHEXIDINE GLUCONATE 0.12 % MT SOLN
OROMUCOSAL | Status: AC
  Administered 2021-09-03: 15 mL via OROMUCOSAL
  Filled 2021-09-03: qty 15

## 2021-09-03 MED ORDER — SEVOFLURANE IN SOLN
RESPIRATORY_TRACT | Status: AC
  Filled 2021-09-03: qty 250

## 2021-09-03 MED ORDER — ONDANSETRON HCL 4 MG/2ML IJ SOLN
INTRAMUSCULAR | Status: DC | PRN
  Administered 2021-09-03: 4 mg via INTRAVENOUS

## 2021-09-03 MED ORDER — PROMETHAZINE HCL 25 MG/ML IJ SOLN: 6.2500 mg | INTRAMUSCULAR | Status: DC | PRN

## 2021-09-03 MED ORDER — ROCURONIUM BROMIDE 10 MG/ML (PF) SYRINGE
PREFILLED_SYRINGE | INTRAVENOUS | Status: AC
  Filled 2021-09-03: qty 10

## 2021-09-03 MED ORDER — ROCURONIUM BROMIDE 100 MG/10ML IV SOLN
INTRAVENOUS | Status: DC | PRN
  Administered 2021-09-03: 50 mg via INTRAVENOUS
  Administered 2021-09-03: 30 mg via INTRAVENOUS
  Administered 2021-09-03: 20 mg via INTRAVENOUS

## 2021-09-03 MED ORDER — OXYCODONE HCL 5 MG PO TABS: 5.0000 mg | ORAL_TABLET | Freq: Once | ORAL | Status: DC | PRN

## 2021-09-03 MED ORDER — PROPOFOL 500 MG/50ML IV EMUL
INTRAVENOUS | Status: DC | PRN
  Administered 2021-09-03: 125 ug/kg/min via INTRAVENOUS

## 2021-09-03 MED ORDER — LIDOCAINE HCL (PF) 2 % IJ SOLN
INTRAMUSCULAR | Status: AC
  Filled 2021-09-03: qty 5

## 2021-09-03 MED ORDER — LACTATED RINGERS IV SOLN: INTRAVENOUS | Status: DC

## 2021-09-03 MED ORDER — KETAMINE HCL 50 MG/5ML IJ SOSY
PREFILLED_SYRINGE | INTRAMUSCULAR | Status: AC
  Filled 2021-09-03: qty 5

## 2021-09-03 MED ORDER — DEXMEDETOMIDINE (PRECEDEX) IN NS 20 MCG/5ML (4 MCG/ML) IV SYRINGE
PREFILLED_SYRINGE | INTRAVENOUS | Status: DC | PRN
  Administered 2021-09-03 (×2): 10 ug via INTRAVENOUS

## 2021-09-03 SURGICAL SUPPLY — 102 items
APPLICATOR COTTON TIP 6 STRL (MISCELLANEOUS) IMPLANT
APPLICATOR COTTON TIP 6IN STRL (MISCELLANEOUS)
BENZOIN TINCTURE PRP APPL 2/3 (GAUZE/BANDAGES/DRESSINGS) ×3 IMPLANT
BLADE ENDOTRAC PUSH EPF/EGR (MISCELLANEOUS) IMPLANT
BLADE MED AGGRESSIVE (BLADE) ×3 IMPLANT
BLADE OSCILLATING/SAGITTAL (BLADE) ×1
BLADE SURG 15 STRL LF DISP TIS (BLADE) IMPLANT
BLADE SURG 15 STRL SS (BLADE)
BLADE SURG MINI STRL (BLADE) IMPLANT
BLADE SW THK.38XMED LNG THN (BLADE) ×2 IMPLANT
BLADE TRIANGLE EPF/EGR ENDO (BLADE) ×3 IMPLANT
BNDG COHESIVE 4X5 TAN ST LF (GAUZE/BANDAGES/DRESSINGS) ×3 IMPLANT
BNDG CONFORM 2 STRL LF (GAUZE/BANDAGES/DRESSINGS) IMPLANT
BNDG CONFORM 3 STRL LF (GAUZE/BANDAGES/DRESSINGS) ×3 IMPLANT
BNDG ELASTIC 4X5.8 VLCR NS LF (GAUZE/BANDAGES/DRESSINGS) ×3 IMPLANT
BNDG ELASTIC 4X5.8 VLCR STR LF (GAUZE/BANDAGES/DRESSINGS) ×6 IMPLANT
BNDG ESMARK 4X12 TAN STRL LF (GAUZE/BANDAGES/DRESSINGS) ×3 IMPLANT
BNDG GAUZE ELAST 4 BULKY (GAUZE/BANDAGES/DRESSINGS) ×3 IMPLANT
BNDG STRETCH 4X75 STRL LF (GAUZE/BANDAGES/DRESSINGS) ×3 IMPLANT
BUR 4X55 1 (BURR) IMPLANT
CUFF TOURN SGL QUICK 12 (TOURNIQUET CUFF) IMPLANT
CUFF TOURN SGL QUICK 18X4 (TOURNIQUET CUFF) IMPLANT
CUFF TOURN SGL QUICK 24 (TOURNIQUET CUFF)
CUFF TRNQT CYL 24X4X16.5-23 (TOURNIQUET CUFF) IMPLANT
DEFOGGER ANTIFOG KIT (MISCELLANEOUS) ×3 IMPLANT
DRAPE EXTREMITY 106X87X128.5 (DRAPES) ×3 IMPLANT
DRAPE FLUOR MINI C-ARM 54X84 (DRAPES) ×3 IMPLANT
DRAPE IMP U-DRAPE 54X76 (DRAPES) ×3 IMPLANT
DRAPE INCISE 23X17 IOBAN STRL (DRAPES) ×1
DRAPE INCISE IOBAN 23X17 STRL (DRAPES) ×2 IMPLANT
DRAPE INCISE IOBAN 66X60 STRL (DRAPES) ×3 IMPLANT
DRSG TEGADERM 4X4.75 (GAUZE/BANDAGES/DRESSINGS) ×6 IMPLANT
DURAPREP 26ML APPLICATOR (WOUND CARE) ×12 IMPLANT
DYNACLIP BONE FIXATION SYSTEM (Bone Implant) ×6 IMPLANT
ELECT REM PT RETURN 9FT ADLT (ELECTROSURGICAL) ×3
ELECTRODE REM PT RTRN 9FT ADLT (ELECTROSURGICAL) ×2 IMPLANT
GAUZE 4X4 16PLY ~~LOC~~+RFID DBL (SPONGE) ×6 IMPLANT
GAUZE SPONGE 4X4 12PLY STRL (GAUZE/BANDAGES/DRESSINGS) ×3 IMPLANT
GAUZE XEROFORM 1X8 LF (GAUZE/BANDAGES/DRESSINGS) ×3 IMPLANT
GLOVE SURG ENC MOIS LTX SZ7.5 (GLOVE) ×3 IMPLANT
GLOVE SURG PR MICRO ENCORE 7.5 (GLOVE) ×3 IMPLANT
GLOVE SURG UNDER LTX SZ8 (GLOVE) ×3 IMPLANT
GOWN STRL REUS W/ TWL XL LVL3 (GOWN DISPOSABLE) ×4 IMPLANT
GOWN STRL REUS W/TWL XL LVL3 (GOWN DISPOSABLE) ×2
IV NS 250ML (IV SOLUTION) ×1
IV NS 250ML BAXH (IV SOLUTION) ×2 IMPLANT
K-WIRE DBL END TROCAR 6X.062 (WIRE)
KIT CARPAL TUNNEL (MISCELLANEOUS) ×1
KIT PRC PRB RTRGD 3ANG KNF HND (MISCELLANEOUS) ×2 IMPLANT
KIT PROCEDURE DRILL (DRILL) ×3 IMPLANT
KIT SHOULDER TRACTION (DRAPES) IMPLANT
KIT TURNOVER KIT A (KITS) ×3 IMPLANT
KWIRE DBL END TROCAR 6X.062 (WIRE) IMPLANT
LABEL OR SOLS (LABEL) ×3 IMPLANT
MANIFOLD NEPTUNE II (INSTRUMENTS) ×3 IMPLANT
NDL SAFETY ECLIPSE 18X1.5 (NEEDLE) IMPLANT
NEEDLE FILTER BLUNT 18X 1/2SAF (NEEDLE) ×1
NEEDLE FILTER BLUNT 18X1 1/2 (NEEDLE) ×2 IMPLANT
NEEDLE HYPO 18GX1.5 SHARP (NEEDLE)
NEEDLE HYPO 25GX1X1/2 BEV (NEEDLE) IMPLANT
NEEDLE HYPO 25X1 1.5 SAFETY (NEEDLE) ×3 IMPLANT
NS IRRIG 500ML POUR BTL (IV SOLUTION) IMPLANT
PACK EXTREMITY ARMC (MISCELLANEOUS) ×3 IMPLANT
PAD ABD DERMACEA PRESS 5X9 (GAUZE/BANDAGES/DRESSINGS) ×3 IMPLANT
PADDING CAST 4IN STRL (MISCELLANEOUS) ×2
PADDING CAST BLEND 4X4 NS (MISCELLANEOUS) ×3 IMPLANT
PADDING CAST BLEND 4X4 STRL (MISCELLANEOUS) ×4 IMPLANT
PENCIL ELECTRO HAND CTR (MISCELLANEOUS) ×3 IMPLANT
RASP SM TEAR CROSS CUT (RASP) ×3 IMPLANT
SPLINT CAST 1 STEP 4X30 (MISCELLANEOUS) IMPLANT
STAPLE DYNACLIP 14X14X14 (Staple) ×3 IMPLANT
STAPLE DYNACLIP 18X18X18 (Staple) ×6 IMPLANT
STOCKINETTE IMPERV 14X48 (MISCELLANEOUS) ×3 IMPLANT
STOCKINETTE M/LG 89821 (MISCELLANEOUS) ×3 IMPLANT
STRIP CLOSURE SKIN 1/4X4 (GAUZE/BANDAGES/DRESSINGS) ×3 IMPLANT
SUT ETHIBOND 2-0 (SUTURE) ×6 IMPLANT
SUT ETHILON 3-0 (SUTURE) ×3 IMPLANT
SUT ETHILON 4-0 (SUTURE) ×1
SUT ETHILON 4-0 FS2 18XMFL BLK (SUTURE) ×2
SUT ETHILON 5-0 FS-2 18 BLK (SUTURE) IMPLANT
SUT MNCRL 4-0 (SUTURE) ×4
SUT MNCRL 4-0 27XMFL (SUTURE) ×8
SUT PDS 2-0 27IN (SUTURE) ×3 IMPLANT
SUT ULTRABRAID #2 38 (SUTURE) ×3 IMPLANT
SUT VIC AB 2-0 SH 27 (SUTURE) ×2
SUT VIC AB 2-0 SH 27XBRD (SUTURE) ×4 IMPLANT
SUT VIC AB 3-0 SH 27 (SUTURE) ×4
SUT VIC AB 3-0 SH 27X BRD (SUTURE) ×8 IMPLANT
SUT VIC AB 4-0 FS2 27 (SUTURE) IMPLANT
SUT VIC AB 4-0 SH 27 (SUTURE) ×1
SUT VIC AB 4-0 SH 27XANBCTRL (SUTURE) ×2 IMPLANT
SUT VICRYL 3-0 CR8 SH (SUTURE) ×6 IMPLANT
SUT VICRYL AB 3-0 FS1 BRD 27IN (SUTURE) ×3 IMPLANT
SUTURE ETHLN 4-0 FS2 18XMF BLK (SUTURE) ×2 IMPLANT
SUTURE MNCRL 4-0 27XMF (SUTURE) ×8 IMPLANT
SYR 10ML LL (SYRINGE) ×3 IMPLANT
SYR 3ML LL SCALE MARK (SYRINGE) ×3 IMPLANT
SYR 5ML LL (SYRINGE) IMPLANT
TRAY FOLEY MTR SLVR 16FR STAT (SET/KITS/TRAYS/PACK) ×3 IMPLANT
WAND TOPAZ MICRO DEBRIDER (MISCELLANEOUS) ×3 IMPLANT
WATER STERILE IRR 1000ML POUR (IV SOLUTION) ×3 IMPLANT
WATER STERILE IRR 500ML POUR (IV SOLUTION) ×3 IMPLANT

## 2021-09-03 NOTE — Anesthesia Procedure Notes (Signed)
Procedure Name: Intubation Date/Time: 09/03/2021 7:43 AM Performed by: Debe Coder, CRNA Pre-anesthesia Checklist: Patient identified, Emergency Drugs available, Suction available and Patient being monitored Patient Re-evaluated:Patient Re-evaluated prior to induction Oxygen Delivery Method: Circle system utilized Preoxygenation: Pre-oxygenation with 100% oxygen Induction Type: IV induction Ventilation: Mask ventilation without difficulty Laryngoscope Size: Mac and 3 Grade View: Grade II Tube type: Oral Tube size: 6.5 mm Number of attempts: 1 Airway Equipment and Method: Stylet and Oral airway Placement Confirmation: ETT inserted through vocal cords under direct vision, positive ETCO2 and breath sounds checked- equal and bilateral Secured at: 20.5 cm Tube secured with: Tape Dental Injury: Teeth and Oropharynx as per pre-operative assessment

## 2021-09-03 NOTE — Op Note (Signed)
Operative note   Surgeon:Tyreak Reagle Lawyer: None    Preop diagnosis: 1.  Left lower extremity gastroc equinus 2.  Calcaneal varus 3.  Peroneal longus and brevis tendon tear 4.  Lateral ankle instability 5.  Plantarflexed first metatarsal with cavus foot 6.  Exostosis dorsal midfoot    Postop diagnosis: Same    Procedure: 1.  Strayer gastroc recession left lower leg 2.  Dwyer calcaneal osteotomy left calcaneus 3.  Peroneal brevis tendon repair 4.  Peroneal longus tendon repair 5.  Brostrm Gould lateral ankle stabilization 6.  Endoscopic plantar fasciotomy left heel 7.  Dorsiflexor he first metatarsal osteotomy left foot 8.  Exostectomy medial cuneiform left foot    EBL: Minimal    Anesthesia:local and general.  Local consisted of a total of 20 cc of Exparel infiltrated along all areas as well as 0.25% bupivacaine with epinephrine    Hemostasis: Thigh tourniquet inflated to 250 mmHg for 120-minute    Specimen: 1.  Peroneal tendon tear 2.  Exostosis dorsal midfoot    Complications: None    Operative indications:Tiffany Barajas is an 60 y.o. that presents today for surgical intervention.  The risks/benefits/alternatives/complications have been discussed and consent has been given.    Procedure:  Patient was brought into the OR and placed on the operating table in theprone position. After anesthesia was obtained theleft lower extremity was prepped and draped in usual sterile fashion.  Attention was directed to the posterior aspect of the calf at the gastroc soleal junction.  Longitudinal incision was performed.  Sharp and blunt dissection carried down to the peritenon.  The small saphenous vein and sural nerve were noted and retracted throughout the procedure.  Peritenon was then incised.  A medial to lateral Strayer gastroc recession was performed with good excursion and muscle belly noted.  Good dorsiflexion of the foot was noted.  The wound was flushed with copious amounts of  irrigation.  Closure was performed with a combination of 3-0 and 4-0 Vicryl and the skin with Monocryl.  This was then covered for a sterile prep and drape.  Patient was then repositioned in the supine position.  Sterile prep and drape was then reperformed.  Attention was directed to the lateral aspect of the foot where a longitudinal incision was made along the border between the peroneal tendons and the posterior calcaneus.  Sharp and blunt dissection carried down into the subcutaneous tissue.  The sural nerve was noted and retracted throughout the procedure.  At this time the lateral wall of the calcaneus was dissected out.  A wedge of approximately 5 mm was removed from the lateral aspect of the calcaneus.  This was feathered to good bone to bone apposition.  This was then stabilized with 2 large bone staples.  Good stability was noted in the calcaneus was noted and a much better aligned position.  Attention was then directed to the peroneal tendons where the peroneal sheath was entered.  A large amount of tenosynovitis was noted in removed.  The peroneal brevis tendon had a previous repair.  There was a large bulbous defect that was removed.  This was quite hypertrophied at this time.  The peroneal longus tendon had a longitudinal split tear.  The longitudinal split tear was then removed and excised.  This was then read tubularized with a 3-0 Vicryl.  A large amount of the hypertrophied peroneal brevis tendon was also excised at this time.  This was re-tubularized with a 3-0 Vicryl.  At this time the wound was flushed.  Both peroneal tendons were infiltrated with a Topaz wand.  The peroneal sheath was reanastomosed with a 3-0 Vicryl.  The peroneal retinaculum was reanastomosed at the distal fibula with a pants over vest fashion with a PDS.  Attention was directed to the anterolateral aspect of the ankle.  At this time the extensor retinaculum was freed and retracted distally.  The anterior talofibular  ligament was noted and transected.  In a pants over vest fashion with Ethibond suture was able to reanastomose and tighten up the ligament.  The extensor retinaculum was brought more proximal on the fibula and tightened down with a 2-0 Vicryl.  Closure of the lateral incision was then performed with a 3-0 Vicryl for the deeper layers and 4-0 nylon for the skin  Attention was then directed to the medial aspect of the heel at the level of the plantar fascial insertion.  A small incision was made at this site.  Blunt dissection was carried down to the plantar fascia.  The fascial elevator was then used to loosen the fascia from the deep soft tissue.  A blunt trocar and cannula was introduced from medial to lateral.  A small stab incision was made laterally.  At this time direct visualization of the plantar fashion was noted.  The medial one half of the fascia was then incised.  The muscle belly was noted after incision of the fascia.  The wound was flushed with copious amounts of irrigation.  The medial and lateral incisions were closed after removal of the blunt trocar and cannula with a 3-0 nylon.  Attention was then directed to the dorsal aspect of the first metatarsocuneiform joint.  A palpable exostosis was noted at this level.  Full-thickness incision was taken down to the subcutaneous tissue.  The neurovascular bundle was noted and retracted laterally.  Dissection was carried out and there was a large exostosis at the medial cuneiform abutting the second met cuneiform region.  This was then excised with a rongeur and smoothed with a power rasp.  The exostosis was sent for pathological examination.  The base of the first metatarsal was then exposed.  A dorsal wedge of bone with the apex distally was performed.  The plantar aspect of the cortex was left intact.  After good feathering of the bone with good bone to bone apposition to dorsal medial and dorsal laterally placed compression staples were used.   Excellent stability and alignment was noted at this time.  The dorsiflexion of the first metatarsal was noted.  After all of the procedures were performed there was a good realignment of the calcaneus with much less of the heel height noted along the medial aspect of the foot.  All incisions were closed with deeper layers with 3-0 Vicryl in subcutaneous tissue with 3-0 Vicryl and the skin on the first metatarsal was closed with a 4-0 Monocryl.  All areas were then infiltrated with combination of 0.25% bupivacaine with epinephrine and Exparel long-acting anesthetic.  A large bulky sterile dressing was applied.  The patient was then placed in a posterior splint for stabilization.    Patient tolerated the procedure and anesthesia well.  Was transported from the OR to the PACU with all vital signs stable and vascular status intact. To be discharged per routine protocol.  Will follow up in approximately 1 week in the outpatient clinic.

## 2021-09-03 NOTE — Anesthesia Postprocedure Evaluation (Signed)
Anesthesia Post Note  Patient: MADILYNNE MULLAN  Procedure(s) Performed: RECONSTRUCTION ANKLE- BROSTRUM-GOULD (Left: Ankle) GASTROC RECESSION (Left) FLAT FOOT CORRECTION- EVANS/MCDO (Left) HALLUX VALGUS BASE WEDGE (Left) SADDLEBONE (Left) ENDOSCOPIC PLANTAR FASC RELEASE (Left) FLEXOR TENDON REPAIR-SECOND (Left)  Patient location during evaluation: PACU Anesthesia Type: General Level of consciousness: awake and alert and oriented Pain management: pain level controlled Vital Signs Assessment: post-procedure vital signs reviewed and stable Respiratory status: spontaneous breathing, nonlabored ventilation and respiratory function stable Cardiovascular status: blood pressure returned to baseline and stable Postop Assessment: no signs of nausea or vomiting Anesthetic complications: no   No notable events documented.   Last Vitals:  Vitals:   09/03/21 1215 09/03/21 1223  BP: 107/69 (!) 106/58  Pulse: 69 70  Resp: 16 16  Temp: 36.7 C (!) 36 C  SpO2: 94% 94%    Last Pain:  Vitals:   09/03/21 1223  TempSrc:   PainSc: 0-No pain                 Tynan Boesel

## 2021-09-03 NOTE — Anesthesia Preprocedure Evaluation (Signed)
Anesthesia Evaluation  Patient identified by MRN, date of birth, ID band Patient awake    Reviewed: Allergy & Precautions, NPO status , Patient's Chart, lab work & pertinent test results  History of Anesthesia Complications (+) PONV and history of anesthetic complications  Airway Mallampati: III  TM Distance: >3 FB Neck ROM: Full    Dental  (+) Poor Dentition   Pulmonary neg sleep apnea, neg COPD, former smoker,    breath sounds clear to auscultation- rhonchi (-) wheezing      Cardiovascular hypertension, Pt. on medications (-) angina(-) CAD, (-) Past MI and (-) Cardiac Stents  Rhythm:Regular Rate:Normal - Systolic murmurs and - Diastolic murmurs    Neuro/Psych neg Seizures PSYCHIATRIC DISORDERS Anxiety Depression Charcot-marie-tooth disease     GI/Hepatic Neg liver ROS, GERD  ,  Endo/Other  negative endocrine ROSneg diabetes  Renal/GU negative Renal ROS     Musculoskeletal negative musculoskeletal ROS (+)   Abdominal (+) + obese,   Peds  Hematology  (+) anemia ,   Anesthesia Other Findings Past Medical History: No date: Anemia No date: Anxiety No date: B12 deficiency No date: Barrett's esophagus No date: Cardiomyopathy [facility]) 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 No date: Depression No date: Equivalent angina (HCC) No date: GERD (gastroesophageal reflux disease) No date: Heart murmur No date: HLD (hyperlipidemia) No date: Hypertension No date: Hypoglycemic disorder No date: Overactive bladder No date: Peripheral neuropathy No date: Pneumonia No date: PONV (postoperative nausea and vomiting) No date: Shortness of breath   Reproductive/Obstetrics                             Anesthesia Physical Anesthesia Plan  ASA: 3  Anesthesia Plan: General   Post-op Pain Management:    Induction: Intravenous  PONV Risk Score and Plan: 3 and Ondansetron, Aprepitant, Dexamethasone,  TIVA and Midazolam  Airway Management Planned: Oral ETT  Additional Equipment:   Intra-op Plan:   Post-operative Plan: Extubation in OR  Informed Consent: I have reviewed the patients History and Physical, chart, labs and discussed the procedure including the risks, benefits and alternatives for the proposed anesthesia with the patient or authorized representative who has indicated his/her understanding and acceptance.     Dental advisory given  Plan Discussed with: CRNA and Anesthesiologist  Anesthesia Plan Comments:         Anesthesia Quick Evaluation

## 2021-09-03 NOTE — H&P (Signed)
HISTORY AND PHYSICAL INTERVAL NOTE:  09/03/2021  7:34 AM  Tiffany Barajas  has presented today for surgery, with the diagnosis of - Charcot Marie Tooth muscular atrophy  Peroneal tendinitis of left lower extremity  Gastrocnemius equinus, left Exostosis of left foot.  The various methods of treatment have been discussed with the patient.  No guarantees were given.  After consideration of risks, benefits and other options for treatment, the patient has consented to surgery.  I have reviewed the patients' chart and labs.     A history and physical examination was performed in my office.  The patient was reexamined.  There have been no changes to this history and physical examination.  Tiffany Barajas A

## 2021-09-03 NOTE — Discharge Instructions (Addendum)
[facility]  POST OPERATIVE INSTRUCTIONS FOR DR. Ether Griffins AND DR. BAKER [facility] CLIN[facility] PODIATRY DEPARTMENT   Take your medication as prescribed.  Pain medication should be taken only as needed.  Keep the dressing clean, dry and intact.  Keep your foot elevated above the heart level for the first 48 hours.  We have instructed you to be non-weight bearing.  Always wear your post-op shoe when walking.  Always use your crutches if you are to be non-weight bearing.  Do not take a shower. Baths are permissible as long as the foot is kept out of the water.   Every hour you are awake:  Bend your knee 15 times.   Call [facility] [phone]) if any of the following problems occur: You develop a temperature or fever. The bandage becomes saturated with blood. Medication does not stop your pain. Injury of the foot occurs. Any symptoms of infection including redness, odor, or red streaks running from wound.   AMBULATORY SURGERY  DISCHARGE INSTRUCTIONS   The drugs that you were given will stay in your system until tomorrow so for the next 24 hours you should not:  Drive an automobile Make any legal decisions Drink any alcoholic beverage   You may resume regular meals tomorrow.  Today it is better to start with liquids and gradually work up to solid foods.  You may eat anything you prefer, but it is better to start with liquids, then soup and crackers, and gradually work up to solid foods.   Please notify your doctor immediately if you have any unusual bleeding, trouble breathing, redness and pain at the surgery site, drainage, fever, or pain not relieved by medication.    Your post-operative visit with Dr.                                       is: Date:                        Time:    Please call to schedule your post-operative visit.  Additional Instructions:   LEAVE GREEN ARMBAND ON FOR 4 DAYS

## 2021-09-03 NOTE — Transfer of Care (Signed)
Immediate Anesthesia Transfer of Care Note 2 Patient: Tiffany Barajas  Procedure(s) Performed: RECONSTRUCTION ANKLE- BROSTRUM-GOULD (Left: Ankle) GASTROC RECESSION (Left) FLAT FOOT CORRECTION- EVANS/MCDO (Left) HALLUX VALGUS BASE WEDGE (Left) SADDLEBONE (Left) ENDOSCOPIC PLANTAR FASC RELEASE (Left) FLEXOR TENDON REPAIR-SECOND (Left)  Patient Location: PACU  Anesthesia Type:General  Level of Consciousness: awake, alert , oriented and patient cooperative  Airway & Oxygen Therapy: Patient Spontanous Breathing  Post-op Assessment: Report given to RN and Post -op Vital signs reviewed and stable  Post vital signs: Reviewed and stable  Last Vitals:  Vitals Value Taken Time  BP 106/58 09/03/21 1223  Temp 36 C 09/03/21 1223  Pulse 70 09/03/21 1223  Resp 16 09/03/21 1223  SpO2 94 % 09/03/21 1223    Last Pain:  Vitals:   09/03/21 1223  TempSrc:   PainSc: 0-No pain         Complications: No notable events documented.

## 2021-09-06 ENCOUNTER — Encounter: Payer: Self-pay | Admitting: Podiatry

## 2021-09-06 LAB — SURGICAL PATHOLOGY

## 2022-02-17 ENCOUNTER — Other Ambulatory Visit: Payer: Self-pay | Admitting: Family Medicine

## 2022-02-17 DIAGNOSIS — Z1231 Encounter for screening mammogram for malignant neoplasm of breast: Secondary | ICD-10-CM

## 2022-03-10 ENCOUNTER — Other Ambulatory Visit: Payer: Self-pay

## 2022-03-10 ENCOUNTER — Ambulatory Visit
Admission: RE | Admit: 2022-03-10 | Discharge: 2022-03-10 | Disposition: A | Discharge status: Home / Self Care | Payer: BC Managed Care – PPO | Source: Ambulatory Visit | Attending: Family Medicine | Admitting: Family Medicine

## 2022-03-10 DIAGNOSIS — Z1231 Encounter for screening mammogram for malignant neoplasm of breast: Secondary | ICD-10-CM | POA: Diagnosis present

## 2022-03-10 IMAGING — MG MM DIGITAL SCREENING BILAT W/ TOMO AND CAD
8 series · 8 of 24 positions shown · non-contrast
Comparison: Previous exam(s).

CLINICAL DATA: Screening.

EXAM:
DIGITAL SCREENING BILATERAL MAMMOGRAM WITH TOMOSYNTHESIS AND CAD
TECHNIQUE: Bilateral screening digital craniocaudal and mediolateral oblique
mammograms were obtained. Bilateral screening digital breast
tomosynthesis was performed. The images were evaluated with
computer-aided detection.

[L MLO synth-2D]
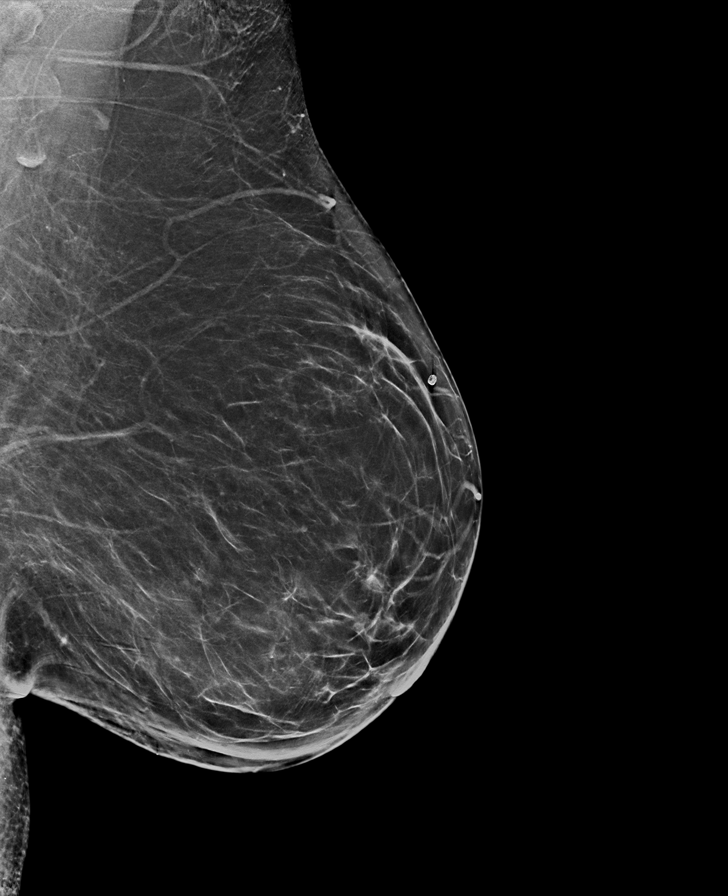

[L CC synth-2D]
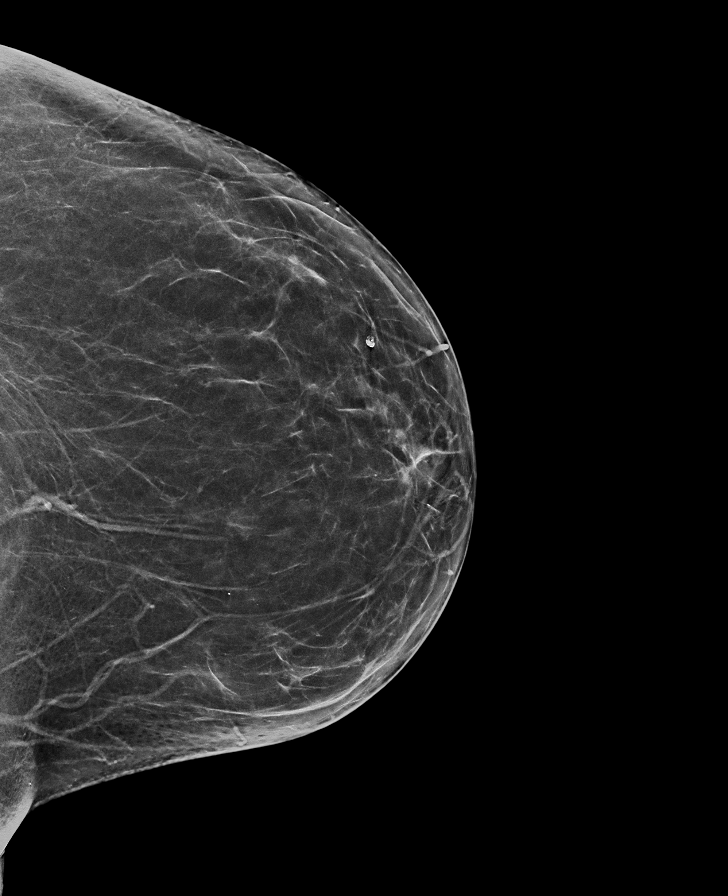

[R MLO synth-2D]
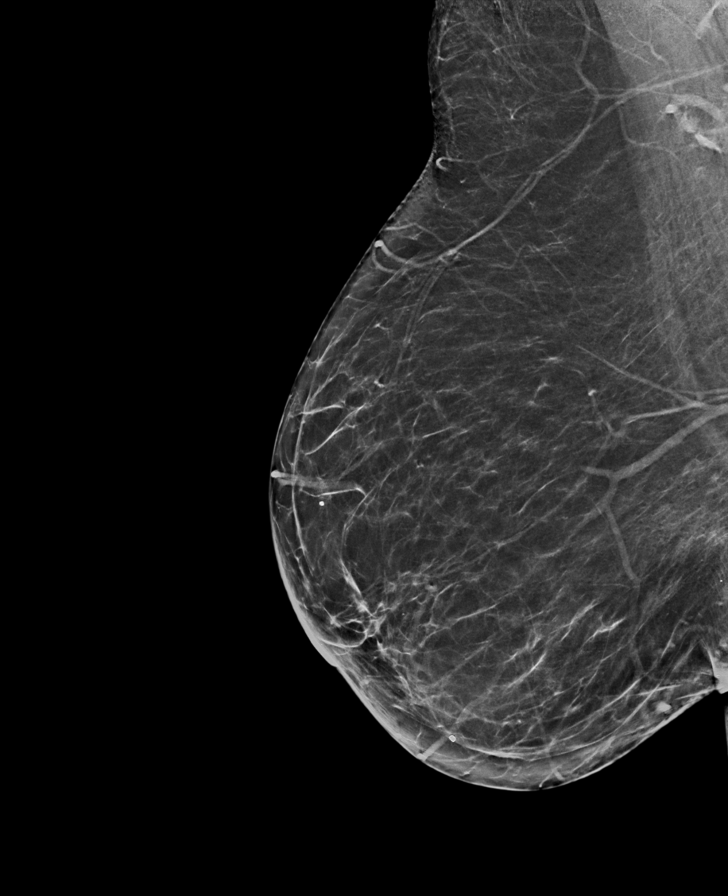

[R CC synth-2D]
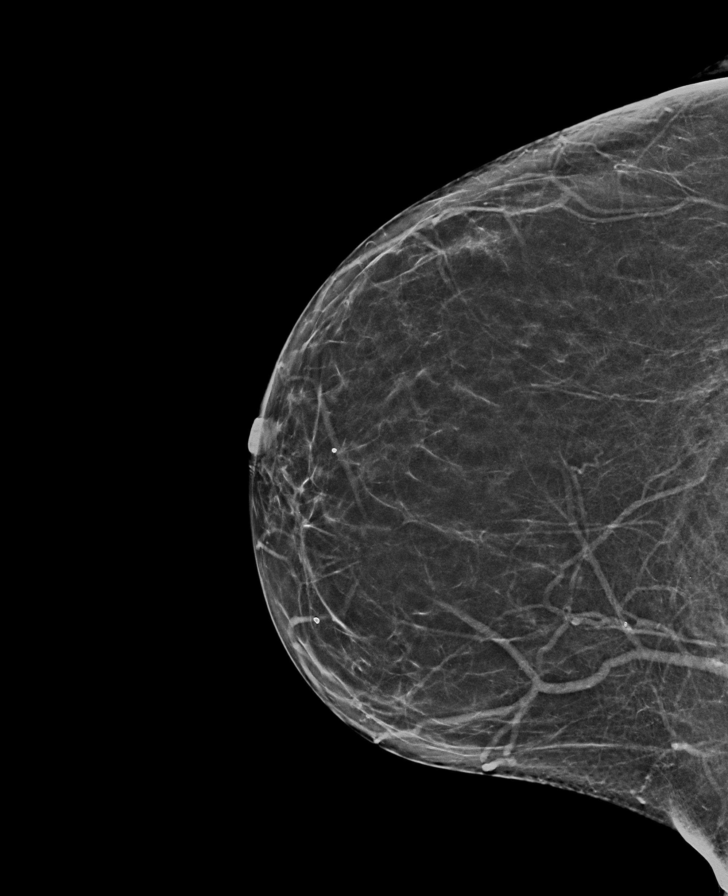

[R CC tomo · tomo slice 32/63.0]
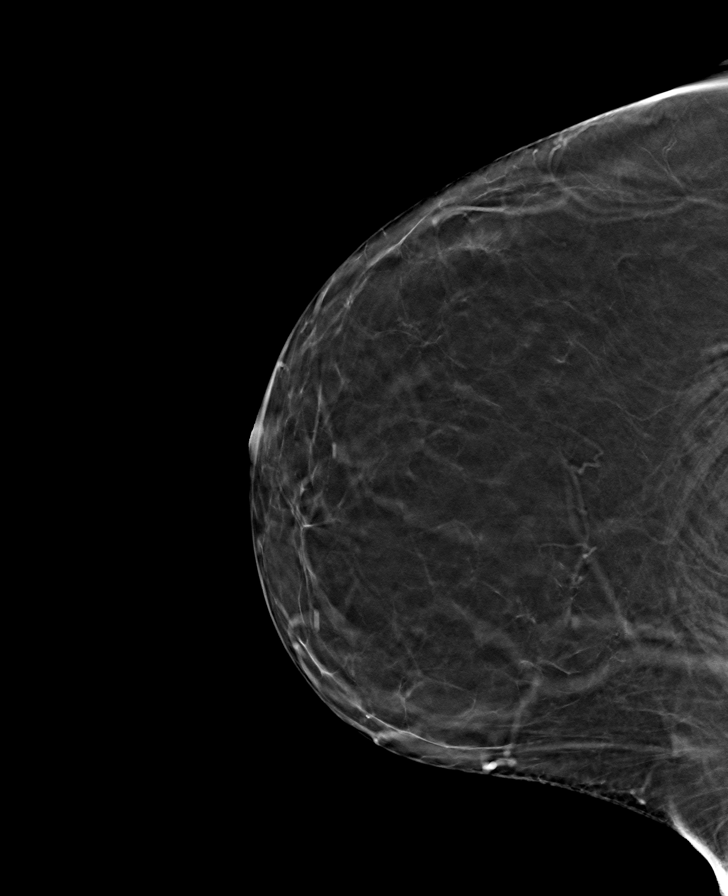

[L CC tomo · tomo slice 37/72.0]
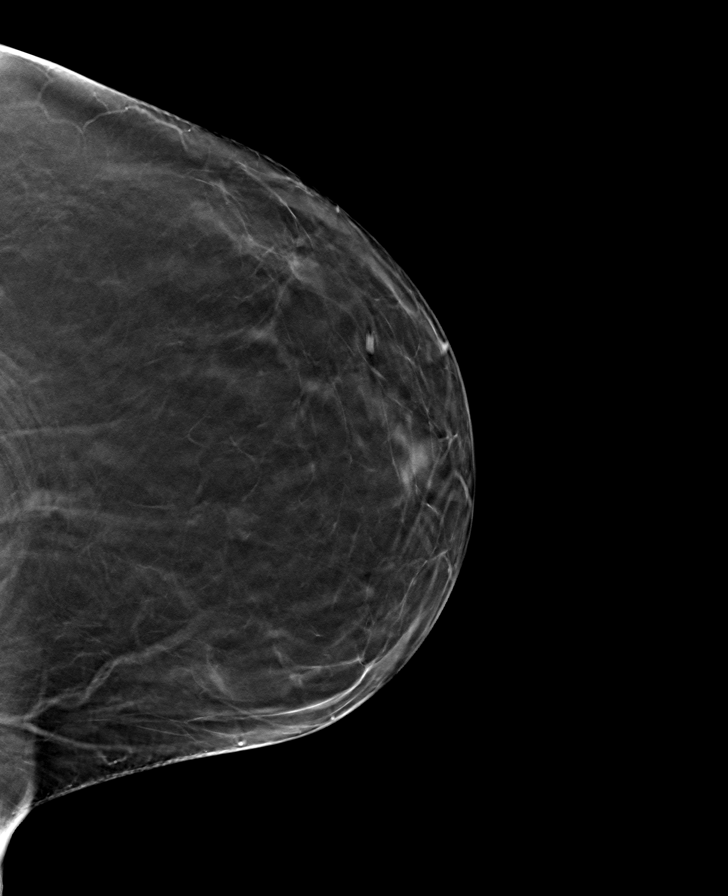

[R MLO tomo · tomo slice 35/70.0]
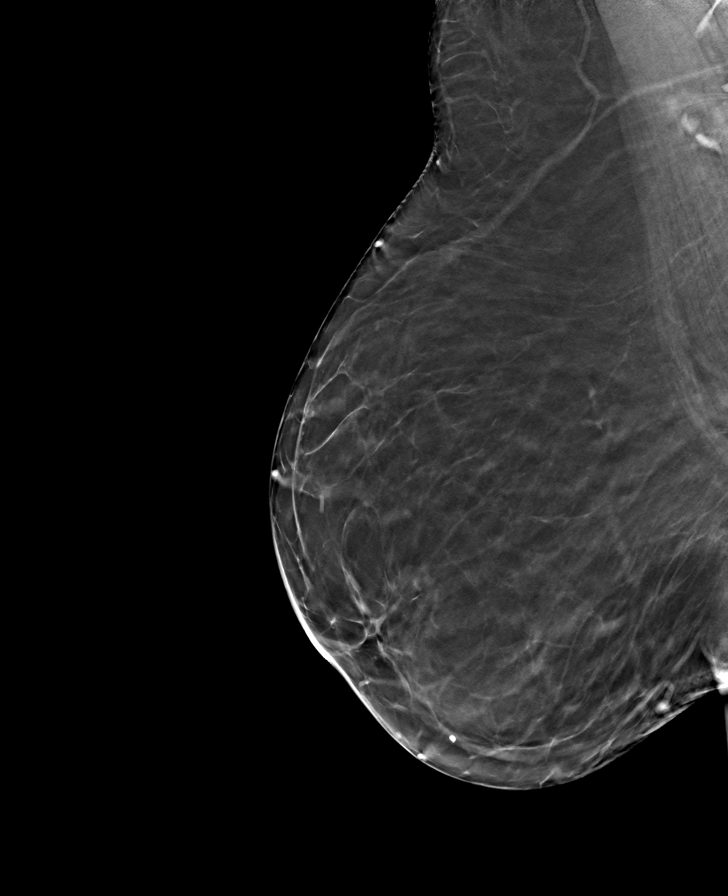

[L MLO tomo · tomo slice 39/77.0]
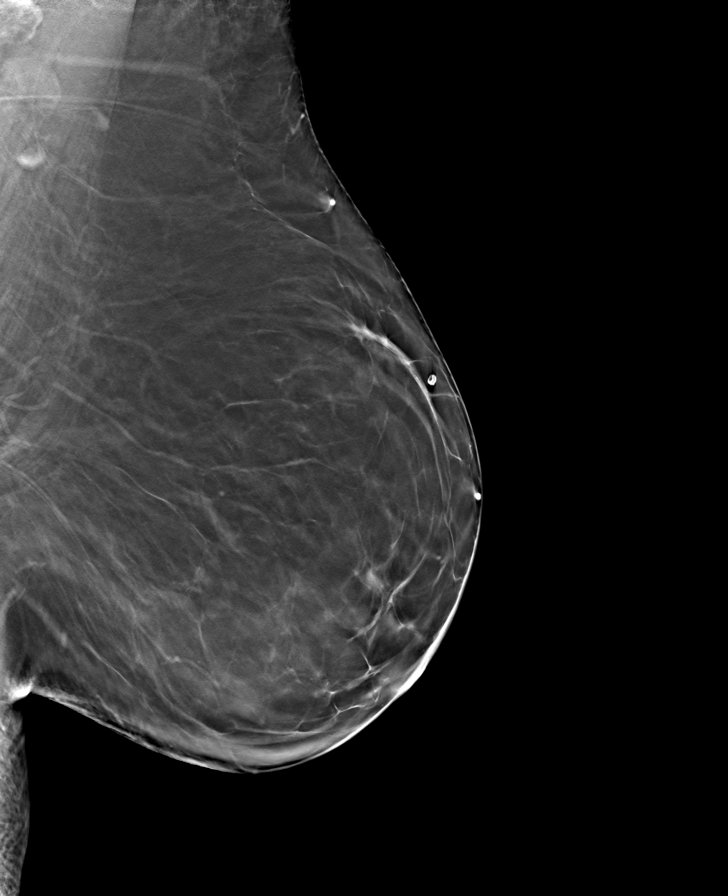

[8 of 24 positions shown; findings below may reference images not displayed]

ACR Breast Density Category b: There are scattered areas of
fibroglandular density.
FINDINGS: There are no findings suspicious for malignancy.
IMPRESSION: No mammographic evidence of malignancy. A result letter of this
screening mammogram will be mailed directly to the patient.

RECOMMENDATION:
Screening mammogram in one year. (Code:[BY])

BI-RADS CATEGORY  1: Negative.

## 2023-04-10 ENCOUNTER — Other Ambulatory Visit: Payer: Self-pay | Admitting: Family Medicine

## 2023-04-10 DIAGNOSIS — Z1231 Encounter for screening mammogram for malignant neoplasm of breast: Secondary | ICD-10-CM

## 2023-05-02 ENCOUNTER — Ambulatory Visit
Admission: RE | Admit: 2023-05-02 | Discharge: 2023-05-02 | Disposition: A | Discharge status: Home / Self Care | Payer: BC Managed Care – PPO | Source: Ambulatory Visit | Attending: Family Medicine | Admitting: Family Medicine

## 2023-05-02 DIAGNOSIS — Z1231 Encounter for screening mammogram for malignant neoplasm of breast: Secondary | ICD-10-CM | POA: Insufficient documentation

## 2023-08-14 ENCOUNTER — Other Ambulatory Visit: Payer: Self-pay | Admitting: Family Medicine

## 2023-08-14 DIAGNOSIS — Z13820 Encounter for screening for osteoporosis: Secondary | ICD-10-CM

## 2023-11-14 ENCOUNTER — Other Ambulatory Visit: Payer: Self-pay | Admitting: Obstetrics and Gynecology

## 2023-11-14 DIAGNOSIS — Z1231 Encounter for screening mammogram for malignant neoplasm of breast: Secondary | ICD-10-CM

## 2023-11-30 ENCOUNTER — Telehealth: Payer: Self-pay

## 2023-11-30 NOTE — Telephone Encounter (Signed)
Patient called in wanting to schedule a EGD and colonoscopy. She is established with [facility] they just did her EGD. I inform her she needs to call [facility] to schedule her procedure.
